# Patient Record
Sex: Female | Born: 1935 | Race: White | Hispanic: No
Health system: Southern US, Community
[De-identification: ages and names within clinical notes are randomized; demographics above are authoritative.]

## PROBLEM LIST (undated history)

## (undated) DIAGNOSIS — I4891 Unspecified atrial fibrillation: Secondary | ICD-10-CM

---

## 2004-02-14 ENCOUNTER — Ambulatory Visit: Payer: Self-pay | Admitting: Internal Medicine

## 2004-07-11 ENCOUNTER — Ambulatory Visit: Payer: Self-pay | Admitting: Rheumatology

## 2005-02-20 ENCOUNTER — Ambulatory Visit: Payer: Self-pay | Admitting: Internal Medicine

## 2005-08-21 ENCOUNTER — Encounter: Payer: Self-pay | Admitting: Internal Medicine

## 2006-02-25 ENCOUNTER — Ambulatory Visit: Payer: Self-pay | Admitting: Internal Medicine

## 2007-04-15 ENCOUNTER — Ambulatory Visit: Payer: Self-pay | Admitting: Internal Medicine

## 2007-07-07 ENCOUNTER — Ambulatory Visit: Payer: Self-pay | Admitting: Internal Medicine

## 2008-02-15 ENCOUNTER — Ambulatory Visit: Payer: Self-pay | Admitting: Internal Medicine

## 2008-04-27 ENCOUNTER — Ambulatory Visit: Payer: Self-pay | Admitting: Internal Medicine

## 2008-04-29 ENCOUNTER — Ambulatory Visit: Payer: Self-pay | Admitting: Internal Medicine

## 2008-05-06 ENCOUNTER — Ambulatory Visit: Payer: Self-pay | Admitting: Pain Medicine

## 2008-06-08 ENCOUNTER — Ambulatory Visit: Payer: Self-pay | Admitting: Pain Medicine

## 2008-06-09 ENCOUNTER — Ambulatory Visit: Payer: Self-pay | Admitting: Pain Medicine

## 2008-11-02 ENCOUNTER — Ambulatory Visit: Payer: Self-pay | Admitting: Internal Medicine

## 2009-04-19 ENCOUNTER — Ambulatory Visit: Payer: Self-pay | Admitting: Internal Medicine

## 2009-05-04 ENCOUNTER — Ambulatory Visit: Payer: Self-pay | Admitting: Internal Medicine

## 2009-06-13 ENCOUNTER — Ambulatory Visit: Payer: Self-pay | Admitting: Family Medicine

## 2010-06-05 ENCOUNTER — Ambulatory Visit: Payer: Self-pay | Admitting: Internal Medicine

## 2011-01-02 ENCOUNTER — Inpatient Hospital Stay: Payer: Self-pay | Admitting: Internal Medicine

## 2011-01-09 ENCOUNTER — Emergency Department: Payer: Self-pay | Admitting: Emergency Medicine

## 2011-03-10 ENCOUNTER — Inpatient Hospital Stay: Payer: Self-pay | Admitting: Internal Medicine

## 2011-03-10 LAB — COMPREHENSIVE METABOLIC PANEL
Albumin: 3.9 g/dL (ref 3.4–5.0)
Alkaline Phosphatase: 55 U/L (ref 50–136)
BUN: 16 mg/dL (ref 7–18)
Bilirubin,Total: 0.4 mg/dL (ref 0.2–1.0)
Co2: 28 mmol/L (ref 21–32)
Creatinine: 0.59 mg/dL — ABNORMAL LOW (ref 0.60–1.30)
EGFR (African American): >60
EGFR (Non-African Amer.): >60
Glucose: 119 mg/dL — ABNORMAL HIGH (ref 65–99)
SGOT(AST): 22 U/L (ref 15–37)
SGPT (ALT): 20 U/L
Total Protein: 7.2 g/dL (ref 6.4–8.2)

## 2011-03-10 LAB — CK TOTAL AND CKMB (NOT AT ARMC)
CK, Total: 67 U/L (ref 21–215)
CK-MB: 1.1 ng/mL (ref 0.5–3.6)

## 2011-03-10 LAB — CBC
HCT: 36.3 % (ref 35.0–47.0)
HGB: 11.9 g/dL — ABNORMAL LOW (ref 12.0–16.0)
MCH: 27.3 pg (ref 26.0–34.0)
MCV: 83 fL (ref 80–100)
RBC: 4.37 10*6/uL (ref 3.80–5.20)
WBC: 6.2 10*3/uL (ref 3.6–11.0)

## 2011-03-15 LAB — CULTURE, BLOOD (SINGLE)

## 2011-06-06 ENCOUNTER — Ambulatory Visit: Payer: Self-pay | Admitting: Internal Medicine

## 2012-01-26 ENCOUNTER — Inpatient Hospital Stay: Payer: Self-pay | Admitting: Internal Medicine

## 2012-01-26 LAB — CBC
HCT: 36.5 % (ref 35.0–47.0)
HGB: 11.7 g/dL — ABNORMAL LOW (ref 12.0–16.0)
MCH: 26.6 pg (ref 26.0–34.0)
MCV: 83 fL (ref 80–100)
RDW: 13.8 % (ref 11.5–14.5)
WBC: 6.7 10*3/uL (ref 3.6–11.0)

## 2012-01-26 LAB — COMPREHENSIVE METABOLIC PANEL
Albumin: 3.9 g/dL (ref 3.4–5.0)
Anion Gap: 9 (ref 7–16)
BUN: 18 mg/dL (ref 7–18)
Chloride: 105 mmol/L (ref 98–107)
Co2: 27 mmol/L (ref 21–32)
Creatinine: 0.72 mg/dL (ref 0.60–1.30)
EGFR (Non-African Amer.): >60
SGPT (ALT): 19 U/L (ref 12–78)
Sodium: 141 mmol/L (ref 136–145)
Total Protein: 7.4 g/dL (ref 6.4–8.2)

## 2012-01-27 LAB — CBC WITH DIFFERENTIAL/PLATELET
Basophil %: 1.2 %
Eosinophil #: 0.4 10*3/uL (ref 0.0–0.7)
Eosinophil %: 10.3 %
HCT: 33.7 % — ABNORMAL LOW (ref 35.0–47.0)
HGB: 11.2 g/dL — ABNORMAL LOW (ref 12.0–16.0)
Lymphocyte #: 1.3 10*3/uL (ref 1.0–3.6)
MCH: 27.3 pg (ref 26.0–34.0)
MCV: 82 fL (ref 80–100)
Monocyte #: 0.4 x10 3/mm (ref 0.2–0.9)
Monocyte %: 10.9 %
Neutrophil #: 1.9 10*3/uL (ref 1.4–6.5)
Neutrophil %: 45.5 %
RBC: 4.09 10*6/uL (ref 3.80–5.20)
WBC: 4.1 10*3/uL (ref 3.6–11.0)

## 2012-01-27 LAB — COMPREHENSIVE METABOLIC PANEL
Albumin: 3.5 g/dL (ref 3.4–5.0)
Anion Gap: 4 — ABNORMAL LOW (ref 7–16)
BUN: 18 mg/dL (ref 7–18)
Chloride: 106 mmol/L (ref 98–107)
Co2: 30 mmol/L (ref 21–32)
Creatinine: 0.68 mg/dL (ref 0.60–1.30)
EGFR (African American): >60
EGFR (Non-African Amer.): >60
Glucose: 97 mg/dL (ref 65–99)
Osmolality: 281 (ref 275–301)
SGOT(AST): 20 U/L (ref 15–37)
Total Protein: 6.6 g/dL (ref 6.4–8.2)

## 2012-01-27 LAB — TROPONIN I: Troponin-I: 0.09 ng/mL — ABNORMAL HIGH

## 2012-01-27 LAB — CK TOTAL AND CKMB (NOT AT ARMC): CK, Total: 74 U/L (ref 21–215)

## 2012-03-16 ENCOUNTER — Emergency Department: Payer: Self-pay | Admitting: Emergency Medicine

## 2012-03-16 LAB — CBC
HCT: 36.3 % (ref 35.0–47.0)
RDW: 13.7 % (ref 11.5–14.5)

## 2012-03-16 LAB — PRO B NATRIURETIC PEPTIDE: B-Type Natriuretic Peptide: 4973 pg/mL — ABNORMAL HIGH (ref 0–450)

## 2012-03-16 LAB — COMPREHENSIVE METABOLIC PANEL
Albumin: 3.6 g/dL (ref 3.4–5.0)
Alkaline Phosphatase: 82 U/L (ref 50–136)
Anion Gap: 7 (ref 7–16)
BUN: 16 mg/dL (ref 7–18)
Bilirubin,Total: 0.5 mg/dL (ref 0.2–1.0)
EGFR (African American): >60
Glucose: 92 mg/dL (ref 65–99)
Osmolality: 284 (ref 275–301)
Potassium: 3.8 mmol/L (ref 3.5–5.1)
SGOT(AST): 21 U/L (ref 15–37)
Sodium: 142 mmol/L (ref 136–145)

## 2012-06-13 ENCOUNTER — Ambulatory Visit: Payer: Self-pay | Admitting: Internal Medicine

## 2012-06-29 ENCOUNTER — Ambulatory Visit: Payer: Self-pay | Admitting: Internal Medicine

## 2012-07-10 ENCOUNTER — Observation Stay: Payer: Self-pay | Admitting: Internal Medicine

## 2012-07-10 LAB — URINALYSIS, COMPLETE
Bacteria: NONE SEEN
Bilirubin,UR: NEGATIVE
Glucose,UR: NEGATIVE mg/dL (ref 0–75)
Ketone: NEGATIVE
Protein: NEGATIVE
RBC,UR: 1 /HPF (ref 0–5)
Squamous Epithelial: <1
WBC UR: <1 /HPF (ref 0–5)

## 2012-07-10 LAB — BASIC METABOLIC PANEL
BUN: 21 mg/dL — ABNORMAL HIGH (ref 7–18)
Co2: 29 mmol/L (ref 21–32)
Creatinine: 0.85 mg/dL (ref 0.60–1.30)
EGFR (Non-African Amer.): >60
Glucose: 110 mg/dL — ABNORMAL HIGH (ref 65–99)
Osmolality: 281 (ref 275–301)
Potassium: 3.3 mmol/L — ABNORMAL LOW (ref 3.5–5.1)
Sodium: 139 mmol/L (ref 136–145)

## 2012-07-10 LAB — CK TOTAL AND CKMB (NOT AT ARMC)
CK, Total: 101 U/L (ref 21–215)
CK, Total: 100 U/L (ref 21–215)
CK-MB: 1.2 ng/mL (ref 0.5–3.6)
CK-MB: 1 ng/mL (ref 0.5–3.6)
CK-MB: 0.9 ng/mL (ref 0.5–3.6)

## 2012-07-10 LAB — CBC
HCT: 33.8 % — ABNORMAL LOW (ref 35.0–47.0)
MCH: 27.4 pg (ref 26.0–34.0)
MCHC: 33.9 g/dL (ref 32.0–36.0)
MCV: 81 fL (ref 80–100)
RBC: 4.18 10*6/uL (ref 3.80–5.20)
WBC: 5.9 10*3/uL (ref 3.6–11.0)

## 2012-07-10 LAB — MAGNESIUM: Magnesium: 2 mg/dL

## 2012-07-10 LAB — TROPONIN I: Troponin-I: 0.1 ng/mL — ABNORMAL HIGH

## 2012-07-11 LAB — LIPID PANEL
Cholesterol: 138 mg/dL (ref 0–200)
HDL Cholesterol: 40 mg/dL (ref 40–60)
Ldl Cholesterol, Calc: 82 mg/dL (ref 0–100)
Triglycerides: 78 mg/dL (ref 0–200)
VLDL Cholesterol, Calc: 16 mg/dL (ref 5–40)

## 2012-07-11 LAB — CBC WITH DIFFERENTIAL/PLATELET
Basophil %: 1.3 %
Eosinophil #: 0.6 10*3/uL (ref 0.0–0.7)
Eosinophil %: 11.8 %
HGB: 10.9 g/dL — ABNORMAL LOW (ref 12.0–16.0)
Lymphocyte %: 30.4 %
MCH: 27 pg (ref 26.0–34.0)
Monocyte #: 0.5 x10 3/mm (ref 0.2–0.9)
Neutrophil #: 2.4 10*3/uL (ref 1.4–6.5)
Neutrophil %: 46.9 %
Platelet: 179 10*3/uL (ref 150–440)
RBC: 4.02 10*6/uL (ref 3.80–5.20)
RDW: 13.9 % (ref 11.5–14.5)
WBC: 5.1 10*3/uL (ref 3.6–11.0)

## 2012-07-11 LAB — BASIC METABOLIC PANEL
Anion Gap: 7 (ref 7–16)
BUN: 16 mg/dL (ref 7–18)
Chloride: 104 mmol/L (ref 98–107)
Co2: 29 mmol/L (ref 21–32)
EGFR (African American): >60
Osmolality: 280 (ref 275–301)
Sodium: 140 mmol/L (ref 136–145)

## 2012-07-11 LAB — TSH: Thyroid Stimulating Horm: 0.796 u[IU]/mL

## 2012-07-29 ENCOUNTER — Ambulatory Visit: Payer: Self-pay | Admitting: Internal Medicine

## 2012-08-05 ENCOUNTER — Inpatient Hospital Stay: Payer: Self-pay | Admitting: Specialist

## 2012-08-05 LAB — BASIC METABOLIC PANEL
Anion Gap: 7 (ref 7–16)
BUN: 16 mg/dL (ref 7–18)
Calcium, Total: 9.5 mg/dL (ref 8.5–10.1)
Chloride: 104 mmol/L (ref 98–107)
Creatinine: 0.81 mg/dL (ref 0.60–1.30)
EGFR (African American): >60
Glucose: 102 mg/dL — ABNORMAL HIGH (ref 65–99)
Potassium: 3.9 mmol/L (ref 3.5–5.1)
Sodium: 140 mmol/L (ref 136–145)

## 2012-08-05 LAB — CBC
HCT: 34.4 % — ABNORMAL LOW (ref 35.0–47.0)
MCHC: 33.5 g/dL (ref 32.0–36.0)
MCV: 81 fL (ref 80–100)
Platelet: 185 10*3/uL (ref 150–440)
RBC: 4.26 10*6/uL (ref 3.80–5.20)
RDW: 14.1 % (ref 11.5–14.5)

## 2012-08-05 LAB — TROPONIN I: Troponin-I: 0.07 ng/mL — ABNORMAL HIGH

## 2012-08-05 LAB — CK TOTAL AND CKMB (NOT AT ARMC)
CK, Total: 78 U/L (ref 21–215)
CK-MB: 0.8 ng/mL (ref 0.5–3.6)

## 2012-08-06 LAB — CK TOTAL AND CKMB (NOT AT ARMC)
CK, Total: 81 U/L (ref 21–215)
CK-MB: 0.9 ng/mL (ref 0.5–3.6)
CK-MB: 0.8 ng/mL (ref 0.5–3.6)

## 2012-08-06 LAB — TROPONIN I: Troponin-I: 0.13 ng/mL — ABNORMAL HIGH

## 2012-08-07 LAB — BASIC METABOLIC PANEL
Anion Gap: 4 — ABNORMAL LOW (ref 7–16)
Calcium, Total: 9.3 mg/dL (ref 8.5–10.1)
Chloride: 104 mmol/L (ref 98–107)
Co2: 31 mmol/L (ref 21–32)
Osmolality: 280 (ref 275–301)
Potassium: 3.4 mmol/L — ABNORMAL LOW (ref 3.5–5.1)
Sodium: 139 mmol/L (ref 136–145)

## 2012-09-01 ENCOUNTER — Emergency Department: Payer: Self-pay | Admitting: Emergency Medicine

## 2012-09-01 LAB — URINALYSIS, COMPLETE
Blood: NEGATIVE
Glucose,UR: NEGATIVE mg/dL (ref 0–75)
Nitrite: NEGATIVE
Protein: NEGATIVE
Squamous Epithelial: <1
WBC UR: <1 /HPF (ref 0–5)

## 2012-09-01 LAB — COMPREHENSIVE METABOLIC PANEL
Albumin: 3.6 g/dL (ref 3.4–5.0)
Anion Gap: 4 — ABNORMAL LOW (ref 7–16)
Bilirubin,Total: 0.4 mg/dL (ref 0.2–1.0)
Calcium, Total: 9.4 mg/dL (ref 8.5–10.1)
Chloride: 104 mmol/L (ref 98–107)
Creatinine: 0.75 mg/dL (ref 0.60–1.30)
Potassium: 3.9 mmol/L (ref 3.5–5.1)
Sodium: 138 mmol/L (ref 136–145)

## 2012-09-01 LAB — PROTIME-INR
INR: 1
Prothrombin Time: 13.7 secs (ref 11.5–14.7)

## 2012-09-01 LAB — CBC
HCT: 33.9 % — ABNORMAL LOW (ref 35.0–47.0)
HGB: 11.4 g/dL — ABNORMAL LOW (ref 12.0–16.0)
MCV: 80 fL (ref 80–100)
RBC: 4.22 10*6/uL (ref 3.80–5.20)
RDW: 14 % (ref 11.5–14.5)
WBC: 4.1 10*3/uL (ref 3.6–11.0)

## 2012-09-05 LAB — COMPREHENSIVE METABOLIC PANEL
BUN: 24 mg/dL — ABNORMAL HIGH (ref 7–18)
Bilirubin,Total: 0.5 mg/dL (ref 0.2–1.0)
Calcium, Total: 9.1 mg/dL (ref 8.5–10.1)
Creatinine: 0.85 mg/dL (ref 0.60–1.30)
EGFR (African American): >60
EGFR (Non-African Amer.): >60
Glucose: 125 mg/dL — ABNORMAL HIGH (ref 65–99)
Potassium: 4.1 mmol/L (ref 3.5–5.1)
SGOT(AST): 150 U/L — ABNORMAL HIGH (ref 15–37)
SGPT (ALT): 153 U/L — ABNORMAL HIGH (ref 12–78)
Sodium: 137 mmol/L (ref 136–145)

## 2012-09-05 LAB — CBC
HCT: 34.1 % — ABNORMAL LOW (ref 35.0–47.0)
HGB: 11.3 g/dL — ABNORMAL LOW (ref 12.0–16.0)
MCV: 82 fL (ref 80–100)
Platelet: 147 10*3/uL — ABNORMAL LOW (ref 150–440)
RBC: 4.17 10*6/uL (ref 3.80–5.20)
WBC: 4.7 10*3/uL (ref 3.6–11.0)

## 2012-09-05 LAB — CK TOTAL AND CKMB (NOT AT ARMC)
CK, Total: 81 U/L (ref 21–215)
CK-MB: 0.6 ng/mL (ref 0.5–3.6)

## 2012-09-06 ENCOUNTER — Inpatient Hospital Stay: Payer: Self-pay | Admitting: Internal Medicine

## 2012-10-25 ENCOUNTER — Inpatient Hospital Stay: Payer: Self-pay | Admitting: Specialist

## 2012-10-25 LAB — BASIC METABOLIC PANEL
Anion Gap: 5 — ABNORMAL LOW (ref 7–16)
BUN: 12 mg/dL (ref 7–18)
EGFR (African American): >60
Glucose: 112 mg/dL — ABNORMAL HIGH (ref 65–99)
Sodium: 134 mmol/L — ABNORMAL LOW (ref 136–145)

## 2012-10-25 LAB — CK-MB
CK-MB: 1.9 ng/mL (ref 0.5–3.6)
CK-MB: 1.9 ng/mL (ref 0.5–3.6)

## 2012-10-25 LAB — CBC
HGB: 10.5 g/dL — ABNORMAL LOW (ref 12.0–16.0)
MCH: 26.9 pg (ref 26.0–34.0)
RBC: 3.89 10*6/uL (ref 3.80–5.20)
RDW: 14.7 % — ABNORMAL HIGH (ref 11.5–14.5)

## 2012-10-25 LAB — DIGOXIN LEVEL: Digoxin: 0.63 ng/mL

## 2012-10-25 LAB — PRO B NATRIURETIC PEPTIDE: B-Type Natriuretic Peptide: 10574 pg/mL — ABNORMAL HIGH (ref 0–450)

## 2012-10-26 LAB — BASIC METABOLIC PANEL
Anion Gap: 5 — ABNORMAL LOW (ref 7–16)
BUN: 16 mg/dL (ref 7–18)
Co2: 32 mmol/L (ref 21–32)
Osmolality: 277 (ref 275–301)
Potassium: 3.5 mmol/L (ref 3.5–5.1)

## 2012-12-05 ENCOUNTER — Encounter: Payer: Self-pay | Admitting: Cardiothoracic Surgery

## 2012-12-29 ENCOUNTER — Encounter: Payer: Self-pay | Admitting: Cardiothoracic Surgery

## 2013-01-29 ENCOUNTER — Encounter: Payer: Self-pay | Admitting: Cardiothoracic Surgery

## 2013-03-01 ENCOUNTER — Encounter: Payer: Self-pay | Admitting: Cardiothoracic Surgery

## 2013-08-11 ENCOUNTER — Ambulatory Visit: Payer: Self-pay | Admitting: Family Medicine

## 2013-09-10 ENCOUNTER — Ambulatory Visit: Payer: Self-pay | Admitting: Unknown Physician Specialty

## 2013-11-23 ENCOUNTER — Ambulatory Visit: Payer: Self-pay | Admitting: Unknown Physician Specialty

## 2014-01-06 ENCOUNTER — Observation Stay: Payer: Self-pay | Admitting: Internal Medicine

## 2014-01-06 LAB — COMPREHENSIVE METABOLIC PANEL
ALBUMIN: 3.4 g/dL (ref 3.4–5.0)
ALK PHOS: 76 U/L
ALT: 26 U/L
AST: 27 U/L (ref 15–37)
Anion Gap: 9 (ref 7–16)
BUN: 15 mg/dL (ref 7–18)
Bilirubin,Total: 0.6 mg/dL (ref 0.2–1.0)
CHLORIDE: 96 mmol/L — AB (ref 98–107)
Calcium, Total: 8.6 mg/dL (ref 8.5–10.1)
Co2: 27 mmol/L (ref 21–32)
Creatinine: 0.67 mg/dL (ref 0.60–1.30)
EGFR (African American): >60
EGFR (Non-African Amer.): >60
Glucose: 135 mg/dL — ABNORMAL HIGH (ref 65–99)
OSMOLALITY: 267 (ref 275–301)
Potassium: 3.3 mmol/L — ABNORMAL LOW (ref 3.5–5.1)
SODIUM: 132 mmol/L — AB (ref 136–145)
Total Protein: 7.3 g/dL (ref 6.4–8.2)

## 2014-01-06 LAB — CBC
HCT: 32.6 % — AB (ref 35.0–47.0)
HGB: 10.5 g/dL — ABNORMAL LOW (ref 12.0–16.0)
MCH: 26.7 pg (ref 26.0–34.0)
MCHC: 32.2 g/dL (ref 32.0–36.0)
MCV: 83 fL (ref 80–100)
PLATELETS: 135 10*3/uL — AB (ref 150–440)
RBC: 3.93 10*6/uL (ref 3.80–5.20)
RDW: 14.3 % (ref 11.5–14.5)
WBC: 7.9 10*3/uL (ref 3.6–11.0)

## 2014-01-06 LAB — PRO B NATRIURETIC PEPTIDE: B-TYPE NATIURETIC PEPTID: 10726 pg/mL — AB (ref 0–450)

## 2014-01-06 LAB — TROPONIN I
Troponin-I: 0.25 ng/mL — ABNORMAL HIGH
Troponin-I: 0.25 ng/mL — ABNORMAL HIGH

## 2014-01-06 LAB — CK TOTAL AND CKMB (NOT AT ARMC)
CK, TOTAL: 122 U/L (ref 26–192)
CK-MB: 0.8 ng/mL (ref 0.5–3.6)

## 2014-01-07 LAB — BASIC METABOLIC PANEL
Anion Gap: 7 (ref 7–16)
BUN: 22 mg/dL — ABNORMAL HIGH (ref 7–18)
Calcium, Total: 8.7 mg/dL (ref 8.5–10.1)
Chloride: 96 mmol/L — ABNORMAL LOW (ref 98–107)
Co2: 31 mmol/L (ref 21–32)
Creatinine: 1.02 mg/dL (ref 0.60–1.30)
EGFR (African American): >60
EGFR (Non-African Amer.): 55 — ABNORMAL LOW
Glucose: 114 mg/dL — ABNORMAL HIGH (ref 65–99)
Osmolality: 272 (ref 275–301)
Potassium: 3.3 mmol/L — ABNORMAL LOW (ref 3.5–5.1)
Sodium: 134 mmol/L — ABNORMAL LOW (ref 136–145)

## 2014-01-07 LAB — TSH: Thyroid Stimulating Horm: 1.32 u[IU]/mL

## 2014-04-10 ENCOUNTER — Inpatient Hospital Stay (HOSPITAL_COMMUNITY): Payer: Medicare Other

## 2014-04-10 ENCOUNTER — Inpatient Hospital Stay (HOSPITAL_COMMUNITY): Payer: Medicare Other | Admitting: Certified Registered"

## 2014-04-10 ENCOUNTER — Encounter (HOSPITAL_COMMUNITY)
Admission: EM | Disposition: A | Discharge status: Hospice | Payer: Self-pay | Source: Home / Self Care | Attending: Neurology

## 2014-04-10 ENCOUNTER — Emergency Department (HOSPITAL_COMMUNITY): Payer: Medicare Other

## 2014-04-10 ENCOUNTER — Inpatient Hospital Stay (HOSPITAL_COMMUNITY)
Admission: EM | Admit: 2014-04-10 | Discharge: 2014-04-16 | DRG: 023 | Disposition: A | Discharge status: Hospice | Payer: Medicare Other | Attending: Neurology | Admitting: Neurology

## 2014-04-10 ENCOUNTER — Encounter (HOSPITAL_COMMUNITY): Payer: Self-pay

## 2014-04-10 DIAGNOSIS — R1314 Dysphagia, pharyngoesophageal phase: Secondary | ICD-10-CM | POA: Insufficient documentation

## 2014-04-10 DIAGNOSIS — Z7982 Long term (current) use of aspirin: Secondary | ICD-10-CM | POA: Diagnosis not present

## 2014-04-10 DIAGNOSIS — D649 Anemia, unspecified: Secondary | ICD-10-CM | POA: Diagnosis present

## 2014-04-10 DIAGNOSIS — R918 Other nonspecific abnormal finding of lung field: Secondary | ICD-10-CM

## 2014-04-10 DIAGNOSIS — I10 Essential (primary) hypertension: Secondary | ICD-10-CM | POA: Diagnosis present

## 2014-04-10 DIAGNOSIS — I639 Cerebral infarction, unspecified: Secondary | ICD-10-CM | POA: Diagnosis present

## 2014-04-10 DIAGNOSIS — Z880 Allergy status to penicillin: Secondary | ICD-10-CM | POA: Diagnosis not present

## 2014-04-10 DIAGNOSIS — I63319 Cerebral infarction due to thrombosis of unspecified middle cerebral artery: Secondary | ICD-10-CM | POA: Diagnosis not present

## 2014-04-10 DIAGNOSIS — Z66 Do not resuscitate: Secondary | ICD-10-CM | POA: Diagnosis not present

## 2014-04-10 DIAGNOSIS — G8929 Other chronic pain: Secondary | ICD-10-CM | POA: Diagnosis not present

## 2014-04-10 DIAGNOSIS — I472 Ventricular tachycardia: Secondary | ICD-10-CM | POA: Diagnosis not present

## 2014-04-10 DIAGNOSIS — I482 Chronic atrial fibrillation, unspecified: Secondary | ICD-10-CM | POA: Insufficient documentation

## 2014-04-10 DIAGNOSIS — I6350 Cerebral infarction due to unspecified occlusion or stenosis of unspecified cerebral artery: Secondary | ICD-10-CM | POA: Diagnosis not present

## 2014-04-10 DIAGNOSIS — J9600 Acute respiratory failure, unspecified whether with hypoxia or hypercapnia: Secondary | ICD-10-CM | POA: Diagnosis present

## 2014-04-10 DIAGNOSIS — G8191 Hemiplegia, unspecified affecting right dominant side: Secondary | ICD-10-CM | POA: Diagnosis present

## 2014-04-10 DIAGNOSIS — E119 Type 2 diabetes mellitus without complications: Secondary | ICD-10-CM | POA: Diagnosis present

## 2014-04-10 DIAGNOSIS — Q251 Coarctation of aorta: Secondary | ICD-10-CM

## 2014-04-10 DIAGNOSIS — I63132 Cerebral infarction due to embolism of left carotid artery: Secondary | ICD-10-CM | POA: Diagnosis not present

## 2014-04-10 DIAGNOSIS — I671 Cerebral aneurysm, nonruptured: Secondary | ICD-10-CM | POA: Diagnosis present

## 2014-04-10 DIAGNOSIS — I63412 Cerebral infarction due to embolism of left middle cerebral artery: Principal | ICD-10-CM | POA: Diagnosis present

## 2014-04-10 DIAGNOSIS — Z978 Presence of other specified devices: Secondary | ICD-10-CM

## 2014-04-10 DIAGNOSIS — Z7951 Long term (current) use of inhaled steroids: Secondary | ICD-10-CM | POA: Diagnosis not present

## 2014-04-10 DIAGNOSIS — E785 Hyperlipidemia, unspecified: Secondary | ICD-10-CM | POA: Diagnosis present

## 2014-04-10 DIAGNOSIS — E0590 Thyrotoxicosis, unspecified without thyrotoxic crisis or storm: Secondary | ICD-10-CM | POA: Diagnosis present

## 2014-04-10 DIAGNOSIS — Z881 Allergy status to other antibiotic agents status: Secondary | ICD-10-CM | POA: Diagnosis not present

## 2014-04-10 DIAGNOSIS — Z515 Encounter for palliative care: Secondary | ICD-10-CM | POA: Diagnosis not present

## 2014-04-10 DIAGNOSIS — Z0189 Encounter for other specified special examinations: Secondary | ICD-10-CM

## 2014-04-10 DIAGNOSIS — Z952 Presence of prosthetic heart valve: Secondary | ICD-10-CM | POA: Diagnosis not present

## 2014-04-10 DIAGNOSIS — I6300 Cerebral infarction due to thrombosis of unspecified precerebral artery: Secondary | ICD-10-CM | POA: Diagnosis not present

## 2014-04-10 DIAGNOSIS — E43 Unspecified severe protein-calorie malnutrition: Secondary | ICD-10-CM | POA: Diagnosis present

## 2014-04-10 DIAGNOSIS — I5023 Acute on chronic systolic (congestive) heart failure: Secondary | ICD-10-CM | POA: Diagnosis present

## 2014-04-10 DIAGNOSIS — E876 Hypokalemia: Secondary | ICD-10-CM | POA: Diagnosis present

## 2014-04-10 DIAGNOSIS — R4701 Aphasia: Secondary | ICD-10-CM | POA: Diagnosis present

## 2014-04-10 DIAGNOSIS — I509 Heart failure, unspecified: Secondary | ICD-10-CM

## 2014-04-10 DIAGNOSIS — Z888 Allergy status to other drugs, medicaments and biological substances status: Secondary | ICD-10-CM | POA: Diagnosis not present

## 2014-04-10 HISTORY — PX: RADIOLOGY WITH ANESTHESIA: SHX6223

## 2014-04-10 HISTORY — DX: Unspecified atrial fibrillation: I48.91

## 2014-04-10 LAB — I-STAT CHEM 8, ED
BUN: 25 mg/dL — AB (ref 6–23)
CHLORIDE: 102 mmol/L (ref 96–112)
Calcium, Ion: 1.17 mmol/L (ref 1.13–1.30)
Creatinine, Ser: 1 mg/dL (ref 0.50–1.10)
GLUCOSE: 100 mg/dL — AB (ref 70–99)
HEMATOCRIT: 37 % (ref 36.0–46.0)
Hemoglobin: 12.6 g/dL (ref 12.0–15.0)
POTASSIUM: 4.2 mmol/L (ref 3.5–5.1)
SODIUM: 140 mmol/L (ref 135–145)
TCO2: 24 mmol/L (ref 0–100)

## 2014-04-10 LAB — GLUCOSE, CAPILLARY: GLUCOSE-CAPILLARY: 185 mg/dL — AB (ref 70–99)

## 2014-04-10 LAB — CBG MONITORING, ED: GLUCOSE-CAPILLARY: 103 mg/dL — AB (ref 70–99)

## 2014-04-10 LAB — I-STAT TROPONIN, ED: Troponin i, poc: 0.11 ng/mL (ref 0.00–0.08)

## 2014-04-10 LAB — COMPREHENSIVE METABOLIC PANEL
ALBUMIN: 3.9 g/dL (ref 3.5–5.2)
ALT: 16 U/L (ref 0–35)
AST: 26 U/L (ref 0–37)
Alkaline Phosphatase: 71 U/L (ref 39–117)
Anion gap: 12 (ref 5–15)
BILIRUBIN TOTAL: 0.7 mg/dL (ref 0.3–1.2)
BUN: 22 mg/dL (ref 6–23)
CO2: 27 mmol/L (ref 19–32)
Calcium: 9.8 mg/dL (ref 8.4–10.5)
Chloride: 101 mmol/L (ref 96–112)
Creatinine, Ser: 0.93 mg/dL (ref 0.50–1.10)
GFR calc non Af Amer: 54 mL/min — ABNORMAL LOW (ref >90)
GFR, EST AFRICAN AMERICAN: 63 mL/min — AB (ref >90)
GLUCOSE: 103 mg/dL — AB (ref 70–99)
POTASSIUM: 4.3 mmol/L (ref 3.5–5.1)
SODIUM: 140 mmol/L (ref 135–145)
TOTAL PROTEIN: 7.1 g/dL (ref 6.0–8.3)

## 2014-04-10 LAB — DIFFERENTIAL
Basophils Absolute: 0 10*3/uL (ref 0.0–0.1)
Basophils Relative: 1 % (ref 0–1)
Eosinophils Absolute: 0.3 10*3/uL (ref 0.0–0.7)
Eosinophils Relative: 5 % (ref 0–5)
LYMPHS ABS: 2.2 10*3/uL (ref 0.7–4.0)
Lymphocytes Relative: 40 % (ref 12–46)
MONO ABS: 0.6 10*3/uL (ref 0.1–1.0)
Monocytes Relative: 11 % (ref 3–12)
NEUTROS ABS: 2.3 10*3/uL (ref 1.7–7.7)
Neutrophils Relative %: 43 % (ref 43–77)

## 2014-04-10 LAB — PROTIME-INR
INR: 1.03 (ref 0.00–1.49)
Prothrombin Time: 13.6 seconds (ref 11.6–15.2)

## 2014-04-10 LAB — CBC
HCT: 34.3 % — ABNORMAL LOW (ref 36.0–46.0)
HEMOGLOBIN: 11.3 g/dL — AB (ref 12.0–15.0)
MCH: 27.2 pg (ref 26.0–34.0)
MCHC: 32.9 g/dL (ref 30.0–36.0)
MCV: 82.5 fL (ref 78.0–100.0)
PLATELETS: 171 10*3/uL (ref 150–400)
RBC: 4.16 MIL/uL (ref 3.87–5.11)
RDW: 14.5 % (ref 11.5–15.5)
WBC: 5.4 10*3/uL (ref 4.0–10.5)

## 2014-04-10 LAB — APTT: aPTT: 28 seconds (ref 24–37)

## 2014-04-10 LAB — DIGOXIN LEVEL: Digoxin Level: 0.6 ng/mL — ABNORMAL LOW (ref 0.8–2.0)

## 2014-04-10 LAB — MRSA PCR SCREENING: MRSA by PCR: NEGATIVE

## 2014-04-10 SURGERY — RADIOLOGY WITH ANESTHESIA
Anesthesia: Monitor Anesthesia Care

## 2014-04-10 MED ORDER — DEXMEDETOMIDINE HCL IN NACL 200 MCG/50ML IV SOLN
0.0000 ug/kg/h | INTRAVENOUS | Status: DC
  Administered 2014-04-10: 0.4 ug/kg/h via INTRAVENOUS
  Administered 2014-04-10: 0.8 ug/kg/h via INTRAVENOUS
  Administered 2014-04-11: 0.4 ug/kg/h via INTRAVENOUS
  Administered 2014-04-11: 0.3 ug/kg/h via INTRAVENOUS
  Filled 2014-04-10 (×4): qty 50

## 2014-04-10 MED ORDER — PRAVASTATIN SODIUM 20 MG PO TABS
20.0000 mg | ORAL_TABLET | Freq: Every day | ORAL | Status: DC
  Filled 2014-04-10 (×2): qty 1

## 2014-04-10 MED ORDER — VECURONIUM BROMIDE 10 MG IV SOLR
INTRAVENOUS | Status: DC | PRN
  Administered 2014-04-10: 2 mg via INTRAVENOUS
  Administered 2014-04-10: 1 mg via INTRAVENOUS
  Administered 2014-04-10: 4 mg via INTRAVENOUS
  Administered 2014-04-10: 2 mg via INTRAVENOUS

## 2014-04-10 MED ORDER — ACETAMINOPHEN 650 MG RE SUPP: 650.0000 mg | RECTAL | Status: DC | PRN

## 2014-04-10 MED ORDER — SODIUM CHLORIDE 0.9 % IV SOLN
INTRAVENOUS | Status: DC | PRN
  Administered 2014-04-10 (×2): via INTRAVENOUS

## 2014-04-10 MED ORDER — NICARDIPINE HCL IN NACL 20-0.86 MG/200ML-% IV SOLN: 5.0000 mg/h | INTRAVENOUS | Status: DC

## 2014-04-10 MED ORDER — ALTEPLASE (STROKE) FULL DOSE INFUSION
0.9000 mg/kg | Freq: Once | INTRAVENOUS | Status: AC
  Administered 2014-04-10: 41 mg via INTRAVENOUS
  Filled 2014-04-10: qty 41

## 2014-04-10 MED ORDER — METHIMAZOLE 5 MG PO TABS
5.0000 mg | ORAL_TABLET | Freq: Every day | ORAL | Status: DC
  Administered 2014-04-11 – 2014-04-13 (×3): 5 mg
  Filled 2014-04-10 (×6): qty 1

## 2014-04-10 MED ORDER — HYDRALAZINE HCL 20 MG/ML IJ SOLN
10.0000 mg | INTRAMUSCULAR | Status: DC | PRN
  Administered 2014-04-10 – 2014-04-13 (×2): 20 mg via INTRAVENOUS
  Administered 2014-04-13: 10 mg via INTRAVENOUS
  Administered 2014-04-15: 20 mg via INTRAVENOUS
  Filled 2014-04-10 (×4): qty 1

## 2014-04-10 MED ORDER — ACETAMINOPHEN 650 MG RE SUPP: 650.0000 mg | Freq: Four times a day (QID) | RECTAL | Status: DC | PRN

## 2014-04-10 MED ORDER — SUCCINYLCHOLINE CHLORIDE 20 MG/ML IJ SOLN
INTRAMUSCULAR | Status: AC | PRN
  Administered 2014-04-10: 100 mg via INTRAVENOUS

## 2014-04-10 MED ORDER — VANCOMYCIN HCL IN DEXTROSE 1-5 GM/200ML-% IV SOLN
1000.0000 mg | Freq: Once | INTRAVENOUS | Status: AC
  Administered 2014-04-10: 1000 mg via INTRAVENOUS
  Filled 2014-04-10: qty 200

## 2014-04-10 MED ORDER — PANTOPRAZOLE SODIUM 40 MG IV SOLR
40.0000 mg | Freq: Every day | INTRAVENOUS | Status: DC
  Administered 2014-04-10 – 2014-04-14 (×5): 40 mg via INTRAVENOUS
  Filled 2014-04-10 (×7): qty 40

## 2014-04-10 MED ORDER — ACETAMINOPHEN 500 MG PO TABS: 1000.0000 mg | ORAL_TABLET | Freq: Four times a day (QID) | ORAL | Status: DC | PRN

## 2014-04-10 MED ORDER — LIDOCAINE HCL (CARDIAC) 20 MG/ML IV SOLN
INTRAVENOUS | Status: AC
  Filled 2014-04-10: qty 5

## 2014-04-10 MED ORDER — SENNOSIDES-DOCUSATE SODIUM 8.6-50 MG PO TABS
1.0000 | ORAL_TABLET | Freq: Every evening | ORAL | Status: DC | PRN
  Filled 2014-04-10: qty 1

## 2014-04-10 MED ORDER — PHENYLEPHRINE HCL 10 MG/ML IJ SOLN
30.0000 ug/min | INTRAVENOUS | Status: DC
  Administered 2014-04-10: 30 ug/min via INTRAVENOUS
  Administered 2014-04-11: 10 ug/min via INTRAVENOUS
  Administered 2014-04-11: 15 ug/min via INTRAVENOUS
  Filled 2014-04-10 (×3): qty 1

## 2014-04-10 MED ORDER — DILTIAZEM HCL 25 MG/5ML IV SOLN: 10.0000 mg | Freq: Once | INTRAVENOUS | Status: DC

## 2014-04-10 MED ORDER — ETOMIDATE 2 MG/ML IV SOLN
INTRAVENOUS | Status: AC | PRN
  Administered 2014-04-10: 10 mg via INTRAVENOUS

## 2014-04-10 MED ORDER — CHLORHEXIDINE GLUCONATE 0.12 % MT SOLN
15.0000 mL | Freq: Two times a day (BID) | OROMUCOSAL | Status: DC
  Administered 2014-04-10 – 2014-04-13 (×6): 15 mL via OROMUCOSAL
  Filled 2014-04-10 (×5): qty 15

## 2014-04-10 MED ORDER — MIDAZOLAM HCL 2 MG/2ML IJ SOLN
2.0000 mg | Freq: Once | INTRAMUSCULAR | Status: AC
  Administered 2014-04-10: 2 mg via INTRAVENOUS
  Filled 2014-04-10: qty 2

## 2014-04-10 MED ORDER — SODIUM CHLORIDE 0.9 % IV BOLUS (SEPSIS): 500.0000 mL | Freq: Once | INTRAVENOUS | Status: DC

## 2014-04-10 MED ORDER — IOHEXOL 350 MG/ML SOLN
50.0000 mL | Freq: Once | INTRAVENOUS | Status: AC | PRN
  Administered 2014-04-10: 50 mL via INTRAVENOUS

## 2014-04-10 MED ORDER — IOHEXOL 300 MG/ML  SOLN
100.0000 mL | Freq: Once | INTRAMUSCULAR | Status: AC | PRN
  Administered 2014-04-10: 120 mL via INTRAVENOUS

## 2014-04-10 MED ORDER — LIDOCAINE HCL 1 % IJ SOLN
INTRAMUSCULAR | Status: AC
  Filled 2014-04-10: qty 20

## 2014-04-10 MED ORDER — FENTANYL CITRATE 0.05 MG/ML IJ SOLN: 50.0000 ug | INTRAMUSCULAR | Status: DC | PRN

## 2014-04-10 MED ORDER — ALTEPLASE 30 MG/30 ML FOR INTERV. RAD
1.0000 mg | INTRA_ARTERIAL | Status: AC | PRN
  Administered 2014-04-10: 5 mg via INTRA_ARTERIAL
  Administered 2014-04-10: 2 mg via INTRA_ARTERIAL
  Administered 2014-04-10: 1 mg via INTRA_ARTERIAL
  Filled 2014-04-10: qty 30

## 2014-04-10 MED ORDER — SODIUM CHLORIDE 0.9 % IV SOLN
INTRAVENOUS | Status: DC
  Administered 2014-04-10 – 2014-04-11 (×2): via INTRAVENOUS

## 2014-04-10 MED ORDER — LABETALOL HCL 5 MG/ML IV SOLN
INTRAVENOUS | Status: DC | PRN
  Administered 2014-04-10: 10 mg via INTRAVENOUS

## 2014-04-10 MED ORDER — CETYLPYRIDINIUM CHLORIDE 0.05 % MT LIQD
7.0000 mL | Freq: Four times a day (QID) | OROMUCOSAL | Status: DC
  Administered 2014-04-11 – 2014-04-13 (×12): 7 mL via OROMUCOSAL

## 2014-04-10 MED ORDER — ONDANSETRON HCL 4 MG/2ML IJ SOLN: 4.0000 mg | Freq: Four times a day (QID) | INTRAMUSCULAR | Status: DC | PRN

## 2014-04-10 MED ORDER — PANTOPRAZOLE SODIUM 40 MG IV SOLR: 40.0000 mg | Freq: Every day | INTRAVENOUS | Status: DC

## 2014-04-10 MED ORDER — NITROGLYCERIN 1 MG/10 ML FOR IR/CATH LAB
INTRA_ARTERIAL | Status: AC
  Filled 2014-04-10: qty 10

## 2014-04-10 MED ORDER — ARTIFICIAL TEARS OP OINT
TOPICAL_OINTMENT | OPHTHALMIC | Status: DC | PRN
  Administered 2014-04-10: 1 via OPHTHALMIC

## 2014-04-10 MED ORDER — LABETALOL HCL 5 MG/ML IV SOLN
INTRAVENOUS | Status: AC
  Filled 2014-04-10: qty 4

## 2014-04-10 MED ORDER — LABETALOL HCL 5 MG/ML IV SOLN
10.0000 mg | INTRAVENOUS | Status: DC | PRN
  Administered 2014-04-13: 10 mg via INTRAVENOUS
  Filled 2014-04-10: qty 4

## 2014-04-10 MED ORDER — STROKE: EARLY STAGES OF RECOVERY BOOK
Freq: Once | Status: DC
Start: 2014-04-10 — End: 2014-04-15
  Filled 2014-04-10: qty 1

## 2014-04-10 MED ORDER — SUCCINYLCHOLINE CHLORIDE 20 MG/ML IJ SOLN
INTRAMUSCULAR | Status: AC
  Filled 2014-04-10: qty 1

## 2014-04-10 MED ORDER — PROPOFOL 10 MG/ML IV EMUL
INTRAVENOUS | Status: AC
  Administered 2014-04-10: 1000 mg
  Filled 2014-04-10: qty 100

## 2014-04-10 MED ORDER — ACETAMINOPHEN 325 MG PO TABS: 650.0000 mg | ORAL_TABLET | ORAL | Status: DC | PRN

## 2014-04-10 MED ORDER — FENTANYL CITRATE 0.05 MG/ML IJ SOLN
INTRAMUSCULAR | Status: DC | PRN
  Administered 2014-04-10 (×3): 50 ug via INTRAVENOUS
  Administered 2014-04-10: 100 ug via INTRAVENOUS

## 2014-04-10 MED ORDER — PHENYLEPHRINE HCL 10 MG/ML IJ SOLN
10.0000 mg | INTRAVENOUS | Status: DC | PRN
  Administered 2014-04-10: 40 ug/min via INTRAVENOUS
  Administered 2014-04-10: 20 ug/min via INTRAVENOUS

## 2014-04-10 MED ORDER — ETOMIDATE 2 MG/ML IV SOLN
INTRAVENOUS | Status: AC
  Filled 2014-04-10: qty 20

## 2014-04-10 MED ORDER — ROCURONIUM BROMIDE 50 MG/5ML IV SOLN
INTRAVENOUS | Status: AC
  Filled 2014-04-10: qty 2

## 2014-04-10 NOTE — Sedation Documentation (Signed)
MD Hyacinth MeekerMiller successfully intibated.

## 2014-04-10 NOTE — Progress Notes (Signed)
Spoke with pt son, [name], he is her healthcare power of attorney and states that she is a DNR. Ron will bring paperwork to the hospital for her chart 04/11/2014.  Acheron Sugg GARNER

## 2014-04-10 NOTE — Anesthesia Preprocedure Evaluation (Signed)
Anesthesia Evaluation    Reviewed: Allergy & Precautions, NPO status , Patient's Chart, lab work & pertinent test results, Unable to perform ROS - Chart review onlyPreop documentation limited or incomplete due to emergent nature of procedure.  Airway Mallampati: Intubated       Dental   Pulmonary          Cardiovascular + dysrhythmias Atrial Fibrillation     Neuro/Psych    GI/Hepatic   Endo/Other    Renal/GU      Musculoskeletal   Abdominal   Peds  Hematology   Anesthesia Other Findings   Reproductive/Obstetrics                             Anesthesia Physical Anesthesia Plan  ASA: IV and emergent  Anesthesia Plan: General   Post-op Pain Management:    Induction:   Airway Management Planned: Oral ETT  Additional Equipment: Arterial line  Intra-op Plan:   Post-operative Plan: Post-operative intubation/ventilation  Informed Consent: I have reviewed the patients History and Physical, chart, labs and discussed the procedure including the risks, benefits and alternatives for the proposed anesthesia with the patient or authorized representative who has indicated his/her understanding and acceptance.   History available from chart only and Only emergency history available  Plan Discussed with: CRNA, Anesthesiologist and Surgeon  Anesthesia Plan Comments:         Anesthesia Quick Evaluation

## 2014-04-10 NOTE — ED Notes (Signed)
The patients critical I-stat troponin of 0.11 was shown to Dr. Hyacinth MeekerMiller.

## 2014-04-10 NOTE — Progress Notes (Signed)
eLink Physician-Brief Progress Note Patient Name: [name] DOB: [birth year] MRN: [id number]   Date of Service  04/10/2014  HPI/Events of Note  Dropped bp on propofol > on hold   Intake/Output Summary (Last 24 hours) at 04/10/14 1812 Last data filed at 04/10/14 1715  Gross per 24 hour  Intake    800 ml  Output   1525 ml  Net   -725 ml    Goal is bp around 149 sys   Lab Results  Component Value Date   WBC 5.4 04/10/2014   HGB 12.6 04/10/2014   HCT 37.0 04/10/2014   MCV 82.5 04/10/2014   PLT 171 04/10/2014        eICU Interventions  NS x 500 x 2 if needed Neo drip as pt in afib      Intervention Category Major Interventions: Shock - evaluation and management  Sandrea HughsMichael Leslea Vowles 04/10/2014, 6:12 PM

## 2014-04-10 NOTE — Consult Note (Signed)
PULMONARY / CRITICAL CARE MEDICINE   Name: [name] MRN: [id number] DOB: [birth year]    ADMISSION DATE:  04/10/2014 CONSULTATION DATE:  3/12  REFERRING MD :  Leroy Kennedyamilo   CHIEF COMPLAINT:  Vent management s/p CVA   INITIAL PRESENTATION:  81 year old female admitted on 3/12 s/p left MCA territory CVA. Was given systemic TPA in ER then went to IR for endovascular intervention which included: intra-arterial TPA and clot retrieval. Returned to ICU on vent.  PCCM was asked to help w/ post op vent management   STUDIES:  CT angio 3/12:  Occlusion of the left middle cerebral artery at the bifurcation with opacification of only the most anterior MCA branch vessels. Some pial collaterals are evident. Evidence of early infarct involving the anterior left MCA distribution with cortical indistinctness in decreased cortical definition in the posterior left insular cortex. The basal ganglia are intact. Marked diffuse white matter hypoattenuation is evident bilaterally, compatible with chronic microvascular ischemia.Extensive atherosclerotic changes involving with the carotid bifurcations bilaterally and cavernous internal carotid arteries without significant stenoses. Moderate tortuosity throughout the neck.Prominent thyroid goiter, more evident on the right. Scoliosis.  SIGNIFICANT EVENTS: 3/12 TPA in ER then went to IR for Endovascular  3/12: S/P 4 vessel cerebral arteriogram followed by near complete revascularization of occluded Lt MCA mid M1 segment with X2 passes of 4mm x 30 mm Trevoprovue retrieval device and 8mg  of Superselective intracranial TPA with establishment of TICI 2B flow In the LT MCA distribution  HISTORY OF PRESENT ILLNESS:   81 year old w/ significant h/o AF, HTN and prior AVR (remote). Not on anticoagulation. Lives at home independent. Still drives. Was at gas station pumping gas and was witnessed to fall against care w/ right sided facial droop and right sided hemiparesis. EMS  was called. On arrival she was globally aphasic. Dx eval via CT brain: c/w left MCA CVA. 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 She was intubated for progressive MS decline, was given IV TPA in ER started at 1223, then transferred to IR for Endovascular intervention   PAST MEDICAL HISTORY :   has a past medical history of Atrial fibrillation.  has no past surgical history on file. Prior to Admission medications   Medication Sig Start Date End Date Taking? Authorizing Provider  aspirin 325 MG tablet Take 325 mg by mouth daily.   Yes Historical Provider, MD  beta carotene w/minerals (OCUVITE) tablet Take 1 tablet by mouth daily.   Yes Historical Provider, MD  DIGOX 125 MCG tablet Take 125 mcg by mouth daily. 03/15/14  Yes Historical Provider, MD  furosemide (LASIX) 40 MG tablet Take 60 mg by mouth 2 (two) times daily. 03/29/14  Yes Historical Provider, MD  lisinopril (PRINIVIL,ZESTRIL) 2.5 MG tablet Take 2.5 mg by mouth daily. 03/15/14  Yes Historical Provider, MD  methimazole (TAPAZOLE) 5 MG tablet Take 5 mg by mouth daily. 03/22/14  Yes Historical Provider, MD  metoprolol (LOPRESSOR) 100 MG tablet Take 50 mg by mouth 2 (two) times daily. 03/22/14  Yes Historical Provider, MD  omeprazole (PRILOSEC) 40 MG capsule Take 40 mg by mouth daily. 03/01/14  Yes Historical Provider, MD  potassium chloride SA (K-DUR,KLOR-CON) 20 MEQ tablet Take 30 mEq by mouth 2 (two) times daily. 04/05/14  Yes Historical Provider, MD  pravastatin (PRAVACHOL) 20 MG tablet Take 20 mg by mouth every evening. 04/05/14  Yes Historical Provider, MD  traMADol-acetaminophen (ULTRACET) 37.5-325 MG per tablet Take 1 tablet by mouth every 6 (  six) hours as needed (pain).   Yes Historical Provider, MD  VENTOLIN HFA 108 (90 BASE) MCG/ACT inhaler Inhale 2 puffs into the lungs every 6 (six) hours as needed. 01/08/14  Yes Historical Provider, MD   Allergies  Allergen Reactions  . Keflex [Cephalexin] Other (See Comments)    Unk  .  Penicillins Other (See Comments)    Unk  . Prednisone Other (See Comments)    Unk  . Sulfa Antibiotics Other (See Comments)    Unk    FAMILY HISTORY:  has no family status information on file.  SOCIAL HISTORY:    REVIEW OF SYSTEMS:   Unable   SUBJECTIVE:   VITAL SIGNS: Pulse Rate:  [53-92] 65 (03/12 1713) Resp:  [14-27] 19 (03/12 1713) BP: (128-196)/(49-128) 157/63 mmHg (03/12 1723) SpO2:  [96 %-100 %] 100 % (03/12 1713) Arterial Line BP: (157-162)/(66-70) 162/70 mmHg (03/12 1713) FiO2 (%):  [60 %-100 %] 100 % (03/12 1713) Weight:  [45.36 kg (100 lb)] 45.36 kg (100 lb) (03/12 1723) HEMODYNAMICS:   VENTILATOR SETTINGS: Vent Mode:  [-] PRVC FiO2 (%):  [60 %-100 %] 100 % Set Rate:  [14 bmp] 14 bmp Vt Set:  [400 mL] 400 mL PEEP:  [5 cmH20] 5 cmH20 Plateau Pressure:  [13 cmH20] 13 cmH20 INTAKE / OUTPUT:  Intake/Output Summary (Last 24 hours) at 04/10/14 1731 Last data filed at 04/10/14 1715  Gross per 24 hour  Intake    800 ml  Output   1525 ml  Net   -725 ml    PHYSICAL EXAMINATION: General:  Sedated on vent  Neuro:  RAS -4, still residual NMB  HEENT:  Orally intubated Cardiovascular:  Reg irreg w/ pauses on tele  Lungs:  clear Abdomen:  Soft  Musculoskeletal:  Intact  Skin:  intact  LABS:  CBC  Recent Labs Lab 04/10/14 1148 04/10/14 1157  WBC 5.4  --   HGB 11.3* 12.6  HCT 34.3* 37.0  PLT 171  --    Coag's  Recent Labs Lab 04/10/14 1148  APTT 28  INR 1.03   BMET  Recent Labs Lab 04/10/14 1148 04/10/14 1157  NA 140 140  K 4.3 4.2  CL 101 102  CO2 27  --   BUN 22 25*  CREATININE 0.93 1.00  GLUCOSE 103* 100*   Electrolytes  Recent Labs Lab 04/10/14 1148  CALCIUM 9.8   Sepsis Markers No results for input(s): LATICACIDVEN, PROCALCITON, O2SATVEN in the last 168 hours. ABG No results for input(s): PHART, PCO2ART, PO2ART in the last 168 hours. Liver Enzymes  Recent Labs Lab 04/10/14 1148  AST 26  ALT 16  ALKPHOS 71   BILITOT 0.7  ALBUMIN 3.9   Cardiac Enzymes No results for input(s): TROPONINI, PROBNP in the last 168 hours. Glucose  Recent Labs Lab 04/10/14 1239  GLUCAP 103*    Imaging No results found. ETT good position. Chronic interstitial changes. Rotated film   ASSESSMENT / PLAN:  PULMONARY OETT 3/12>>> A: acute respiratory failure s/p CVA  P:   Full vent support  PAD protocol  F/u ABG Daily assessment for weaning after cleared by IR   CARDIOVASCULAR CVL  A:  H/o afib  HTN  P:  Tele  SBP goal  120-140-->will use hydralazine   RENAL A:   No acute but at risk for AKI from dye load.  P:   F/u chemistry in am   GASTROINTESTINAL A:  No acute  P:   PPI for SUP  HEMATOLOGIC A:   No acute but s/p TPA  P:  Trend cbc Limit lab sticks  Transfuse per ICU protocol    INFECTIOUS A:  No evidence of infection  P:   Trend WBC and fever curve   ENDOCRINE A:   No h/o DM  P:   Trend glucose q4  NEUROLOGIC A:   Acute MCA CVA s/p systemic TPA and endovascular intervention w/ Intra-arterial TPA and retrieval device.  Left PCOM aneurysm (to be addressed depending on outcome of above) P:   RASS goal: -2 PAD protocol  SBP goal 120-140 Further imaging per Neuro.    TODAY'S SUMMARY:  81 year old female admitted [facility]12 s/p acute left MCA territory CVA. She is now s/p systemic TPA and in IR. Will cont full vent support; initiate VAP prevention protocol, and PAD protocol. Will hold off on weaning until f/u imaging obtained and cleared by IR. Son is HCPOA. She is DNR.   Critical care by me: 30 minutes. Including ventilator management, BP control, and collaboration w/ staff re: plan of care.    Simonne Martinet ACNP-BC [facility] Pulmonary/Critical Care Pager # [phone] OR # [phone] if no answer   04/10/2014, 5:31 PM   Billy Fischer, MD ; [facility] [phone].  After 5:30 PM or weekends, call [phone]

## 2014-04-10 NOTE — ED Provider Notes (Signed)
CSN: [id number]     Arrival date & time 04/10/14  1143 History   First MD Initiated Contact with Patient 04/10/14 1157     Chief Complaint  Patient presents with  . Code Stroke     (Consider location/radiation/quality/duration/timing/severity/associated sxs/prior Treatment) HPI Comments: 81 year old female, no known prior medical history, family is not here with the patient to give any information, according to the paramedics the patient was pumping gas at a gas station when she was witnessed to fall against her vehicle, have right-sided facial droop, right-sided hemiparesis. In route to the hospital patient was noted to have mild hypertension, irregularly irregular rhythm, did not have any change in her mental status or her function.  The patient is unable to give any history, she is unable to speak  The history is provided by the EMS personnel and the patient.    Past Medical History  Diagnosis Date  . Atrial fibrillation    History reviewed. No pertinent past surgical history. History reviewed. No pertinent family history. History  Substance Use Topics  . Smoking status: Not on file  . Smokeless tobacco: Not on file  . Alcohol Use: Not on file   OB History    No data available     Review of Systems  Unable to perform ROS: Acuity of condition      Allergies  Keflex; Penicillins; Prednisone; and Sulfa antibiotics  Home Medications   Prior to Admission medications   Medication Sig Start Date End Date Taking? Authorizing Provider  aspirin 325 MG tablet Take 325 mg by mouth daily.   Yes Historical Provider, MD  beta carotene w/minerals (OCUVITE) tablet Take 1 tablet by mouth daily.   Yes Historical Provider, MD  DIGOX 125 MCG tablet Take 125 mcg by mouth daily. 03/15/14  Yes Historical Provider, MD  furosemide (LASIX) 40 MG tablet Take 60 mg by mouth 2 (two) times daily. 03/29/14  Yes Historical Provider, MD  lisinopril (PRINIVIL,ZESTRIL) 2.5 MG tablet Take 2.5 mg by  mouth daily. 03/15/14  Yes Historical Provider, MD  methimazole (TAPAZOLE) 5 MG tablet Take 5 mg by mouth daily. 03/22/14  Yes Historical Provider, MD  metoprolol (LOPRESSOR) 100 MG tablet Take 50 mg by mouth 2 (two) times daily. 03/22/14  Yes Historical Provider, MD  omeprazole (PRILOSEC) 40 MG capsule Take 40 mg by mouth daily. 03/01/14  Yes Historical Provider, MD  potassium chloride SA (K-DUR,KLOR-CON) 20 MEQ tablet Take 30 mEq by mouth 2 (two) times daily. 04/05/14  Yes Historical Provider, MD  pravastatin (PRAVACHOL) 20 MG tablet Take 20 mg by mouth every evening. 04/05/14  Yes Historical Provider, MD  traMADol-acetaminophen (ULTRACET) 37.5-325 MG per tablet Take 1 tablet by mouth every 6 (six) hours as needed (pain).   Yes Historical Provider, MD  VENTOLIN HFA 108 (90 BASE) MCG/ACT inhaler Inhale 2 puffs into the lungs every 6 (six) hours as needed. 01/08/14  Yes Historical Provider, MD   BP 140/101 mmHg  Pulse 64  Resp 15  Ht  (1.575 m)  SpO2 99%  LMP  (LMP Unknown) Physical Exam  Constitutional: She appears well-developed and well-nourished. She appears distressed.  HENT:  Head: Normocephalic and atraumatic.  Mouth/Throat: Oropharynx is clear and moist. No oropharyngeal exudate.  Eyes: Conjunctivae and EOM are normal. Pupils are equal, round, and reactive to light. Right eye exhibits no discharge. Left eye exhibits no discharge. No scleral icterus.  Neck: Normal range of motion. Neck supple. No JVD present. No thyromegaly present.  Cardiovascular:  Normal rate, normal heart sounds and intact distal pulses.  Exam reveals no gallop and no friction rub.   No murmur heard. Irregularly irregular rhythm  Pulmonary/Chest: Effort normal and breath sounds normal. No respiratory distress. She has no wheezes. She has no rales.  Decreased respiratory effort, no gag reflex  Abdominal: Soft. Bowel sounds are normal. She exhibits no distension and no mass. There is no tenderness.  Musculoskeletal:  Normal range of motion. She exhibits no edema or tenderness.  Lymphadenopathy:    She has no cervical adenopathy.  Neurological: She is alert.  Right upper extremity hemiparetic, right lower extremity with some tone, upward going toe on the right, downward on the left, pupils equal round and reactive to light, right-sided hemi-neglect is present, cranial nerves III through XII significant for right facial droop, right upper extremity with complete paresis, left upper extremity with normal grip, normal coordination  Skin: Skin is warm and dry. No rash noted. No erythema.  Psychiatric: She has a normal mood and affect. Her behavior is normal.  Nursing note and vitals reviewed.   ED Course  Procedures (including critical care time) Labs Review Labs Reviewed  CBC - Abnormal; Notable for the following:    Hemoglobin 11.3 (*)    HCT 34.3 (*)    All other components within normal limits  COMPREHENSIVE METABOLIC PANEL - Abnormal; Notable for the following:    Glucose, Bld 103 (*)    GFR calc non Af Amer 54 (*)    GFR calc Af Amer 63 (*)    All other components within normal limits  CBG MONITORING, ED - Abnormal; Notable for the following:    Glucose-Capillary 103 (*)    All other components within normal limits  I-STAT TROPOININ, ED - Abnormal; Notable for the following:    Troponin i, poc 0.11 (*)    All other components within normal limits  I-STAT CHEM 8, ED - Abnormal; Notable for the following:    BUN 25 (*)    Glucose, Bld 100 (*)    All other components within normal limits  PROTIME-INR  APTT  DIFFERENTIAL    Imaging Review Ct Angio Head W/cm &/or Wo Cm  04/10/2014   CLINICAL DATA:  Right-sided facial weakness and droop. Right-sided weakness. Sudden onset today. The patient fell on to the foot of her call are.  EXAM: CT ANGIOGRAPHY HEAD AND NECK  TECHNIQUE: Multidetector CT imaging of the head and neck was performed using the standard protocol during bolus administration of  intravenous contrast. Multiplanar CT image reconstructions and MIPs were obtained to evaluate the vascular anatomy. Carotid stenosis measurements (when applicable) are obtained utilizing NASCET criteria, using the distal internal carotid diameter as the denominator.  CONTRAST:  50mL OMNIPAQUE IOHEXOL 350 MG/ML SOLN  COMPARISON:  None.  FINDINGS: CT HEAD  Brain: The patient's head is somewhat oblique in the scanner. Indistinct gray-white differentiation is present in the anterior left frontal lobe above the level the ventricles and a superiorly. There is some indistinctness in the posterior left insular cortex as well. The basal ganglia are intact bilaterally. Extensive periventricular and subcortical white matter hypoattenuation is evident bilaterally. Ventricular size is proportionate to the degree of atrophy. Atherosclerotic calcifications are present within the cavernous internal carotid arteries and at the dural margin of the vertebral arteries  Calvarium and skull base: Extensive metallic artifact is noted from a mandibular prosthesis. The calvarium is intact.  Paranasal sinuses: Clear  Orbits: Unremarkable.  CTA NECK  Aortic arch:  There is a common origin of the left common carotid artery in the innominate artery. Dense atherosclerotic calcifications are present in the aortic arch the and origins the great vessels without a significant stenosis.  Right carotid system: The right common carotid artery is within normal limits. Dense atherosclerotic calcifications are present within the proximal right internal carotid artery without a significant stenosis of greater than 50% relative to the more distal vessel. There is moderate tortuosity of the cervical right ICA without a significant stenosis.  Left carotid system: The left common carotid artery is tortuous proximally with atherosclerotic calcifications but no significant stenoses. Dense atherosclerotic calcifications are evident at the proximal left internal  carotid artery without a significant stenosis. There is marked tortuosity of the cervical left ICA.  Vertebral arteries:Both vertebral arteries originate from the subclavian arteries. There is moderate tortuosity without a significant stenosis. The right vertebral artery is slightly dominant to the left.  Skeleton: Levoconvex curvature is present in the upper thoracic spine with compensatory rightward curvature in the cervical spine. Advanced facet changes are noted. Endplate changes are most pronounced on the left at C3-4 with osseous foraminal narrowing. No focal lytic or blastic lesions are present.  Other neck: A thyroid goiter is more pronounced on the right with heterogeneous density and scattered calcifications. No dominant lesions are present. The goiter extends 6.5 mm cephalo caudad.  No other significant soft tissue lesions are present in the neck or upper mediastinum.  CTA HEAD  Anterior circulation: Atherosclerotic calcifications are present within the cavernous internal carotid arteries bilaterally without a significant stenosis. 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 The right M1 segment is normal. The right A1 segment is aplastic. Both ACA vessels fill from the left. The proximal left M1 segment is unremarkable. The left MCA is occluded at the bifurcation with filling of only the most anterior branch. There is marked attenuation of an inferior posterior branch. The other MCA branch vessels are occluded with some pial collaterals.  Posterior circulation: The right vertebral artery is the dominant vessel. From the left vertebral artery bifurcates at the PICA ascending a small branch to the vertebrobasilar junction. A focal calcification is present at the vertebrobasilar junction. The right PICA is not visualized. The right AICA is dominant. Both posterior cerebral arteries originate from the basilar tip. The PCA branch vessels are intact.  Venous  sinuses: The dural sinuses are patent.  Anatomic variants: None  Delayed phase: No pathologic enhancement is evident.  IMPRESSION: 1. Occlusion of the left middle cerebral artery at the bifurcation with opacification of only the most anterior MCA branch vessels. 2. Some pial collaterals are evident. 3. Evidence of early infarct involving the anterior left MCA distribution with cortical indistinctness in decreased cortical definition in the posterior left insular cortex. The basal ganglia are intact. 4.  ASPECTS score = 7/10 5. Marked diffuse white matter hypoattenuation is evident bilaterally, compatible with chronic microvascular ischemia. 6. Extensive atherosclerotic changes involving with the carotid bifurcations bilaterally and cavernous internal carotid arteries without significant stenoses. 7. Moderate tortuosity throughout the neck. 8. Prominent thyroid goiter, more evident on the right. 9. Scoliosis. These results were called by telephone at the time of interpretation on 04/10/2014 at 12:10 pm to Dr. Leroy Kennedy , who verbally acknowledged these results.   Electronically Signed   By: Marin Roberts M.D.   On: 04/10/2014 12:50   Ct Angio Neck W/cm &/or Wo/cm  04/10/2014   CLINICAL DATA:  Right-sided  facial weakness and droop. Right-sided weakness. Sudden onset today. The patient fell on to the foot of her call are.  EXAM: CT ANGIOGRAPHY HEAD AND NECK  TECHNIQUE: Multidetector CT imaging of the head and neck was performed using the standard protocol during bolus administration of intravenous contrast. Multiplanar CT image reconstructions and MIPs were obtained to evaluate the vascular anatomy. Carotid stenosis measurements (when applicable) are obtained utilizing NASCET criteria, using the distal internal carotid diameter as the denominator.  CONTRAST:  50mL OMNIPAQUE IOHEXOL 350 MG/ML SOLN  COMPARISON:  None.  FINDINGS: CT HEAD  Brain: The patient's head is somewhat oblique in the scanner. Indistinct  gray-white differentiation is present in the anterior left frontal lobe above the level the ventricles and a superiorly. There is some indistinctness in the posterior left insular cortex as well. The basal ganglia are intact bilaterally. Extensive periventricular and subcortical white matter hypoattenuation is evident bilaterally. Ventricular size is proportionate to the degree of atrophy. Atherosclerotic calcifications are present within the cavernous internal carotid arteries and at the dural margin of the vertebral arteries  Calvarium and skull base: Extensive metallic artifact is noted from a mandibular prosthesis. The calvarium is intact.  Paranasal sinuses: Clear  Orbits: Unremarkable.  CTA NECK  Aortic arch: There is a common origin of the left common carotid artery in the innominate artery. Dense atherosclerotic calcifications are present in the aortic arch the and origins the great vessels without a significant stenosis.  Right carotid system: The right common carotid artery is within normal limits. Dense atherosclerotic calcifications are present within the proximal right internal carotid artery without a significant stenosis of greater than 50% relative to the more distal vessel. There is moderate tortuosity of the cervical right ICA without a significant stenosis.  Left carotid system: The left common carotid artery is tortuous proximally with atherosclerotic calcifications but no significant stenoses. Dense atherosclerotic calcifications are evident at the proximal left internal carotid artery without a significant stenosis. There is marked tortuosity of the cervical left ICA.  Vertebral arteries:Both vertebral arteries originate from the subclavian arteries. There is moderate tortuosity without a significant stenosis. The right vertebral artery is slightly dominant to the left.  Skeleton: Levoconvex curvature is present in the upper thoracic spine with compensatory rightward curvature in the cervical  spine. Advanced facet changes are noted. Endplate changes are most pronounced on the left at C3-4 with osseous foraminal narrowing. No focal lytic or blastic lesions are present.  Other neck: A thyroid goiter is more pronounced on the right with heterogeneous density and scattered calcifications. No dominant lesions are present. The goiter extends 6.5 mm cephalo caudad.  No other significant soft tissue lesions are present in the neck or upper mediastinum.  CTA HEAD  Anterior circulation: Atherosclerotic calcifications are present within the cavernous internal carotid arteries bilaterally without a significant stenosis. 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 The right M1 segment is normal. The right A1 segment is aplastic. Both ACA vessels fill from the left. The proximal left M1 segment is unremarkable. The left MCA is occluded at the bifurcation with filling of only the most anterior branch. There is marked attenuation of an inferior posterior branch. The other MCA branch vessels are occluded with some pial collaterals.  Posterior circulation: The right vertebral artery is the dominant vessel. From the left vertebral artery bifurcates at the PICA ascending a small branch to the vertebrobasilar junction. A focal calcification is present at the vertebrobasilar junction. The  right PICA is not visualized. The right AICA is dominant. Both posterior cerebral arteries originate from the basilar tip. The PCA branch vessels are intact.  Venous sinuses: The dural sinuses are patent.  Anatomic variants: None  Delayed phase: No pathologic enhancement is evident.  IMPRESSION: 1. Occlusion of the left middle cerebral artery at the bifurcation with opacification of only the most anterior MCA branch vessels. 2. Some pial collaterals are evident. 3. Evidence of early infarct involving the anterior left MCA distribution with cortical indistinctness in decreased cortical  definition in the posterior left insular cortex. The basal ganglia are intact. 4.  ASPECTS score = 7/10 5. Marked diffuse white matter hypoattenuation is evident bilaterally, compatible with chronic microvascular ischemia. 6. Extensive atherosclerotic changes involving with the carotid bifurcations bilaterally and cavernous internal carotid arteries without significant stenoses. 7. Moderate tortuosity throughout the neck. 8. Prominent thyroid goiter, more evident on the right. 9. Scoliosis. These results were called by telephone at the time of interpretation on 04/10/2014 at 12:10 pm to Dr. Leroy KennedyAMILO , who verbally acknowledged these results.   Electronically Signed   By: Marin Robertshristopher  Mattern M.D.   On: 04/10/2014 12:50   Dg Chest Port 1 View  04/10/2014   CLINICAL DATA:  Intubation, atrial fibrillation ; LEFT facial droop, flaccid LEFT arm, aphasic, fell while pumping gas at gas station  EXAM: PORTABLE CHEST - 1 VIEW  COMPARISON:  Portable exam 1238 hr compared to 01/06/2014  FINDINGS: Tip of endotracheal tube projects 1.1 cm above carina.  Enlargement of cardiac silhouette post TAVR.  Rotation to the RIGHT.  Tortuous aorta with atherosclerotic calcification.  No definite acute infiltrate, pleural effusion or pneumothorax.  Slight chronic accentuation of interstitial markings not significantly changed from previous exam.  Bones demineralized.  IMPRESSION: Enlargement of cardiac silhouette post TAVR.  Mild chronic interstitial changes without acute infiltrate.   Electronically Signed   By: Ulyses SouthwardMark  Boles M.D.   On: 04/10/2014 13:25     EKG Interpretation   Date/Time:  Saturday April 10 2014 12:13:59 EST Ventricular Rate:  73 PR Interval:    QRS Duration: 124 QT Interval:  376 QTC Calculation: 414 R Axis:   71 Text Interpretation:  Atrial fibrillation LVH with secondary  repolarization abnormality Anterior Q waves, possibly due to LVH ST  depression, consider ischemia, diffuse lds Abnormal ekg No old  tracing to  compare Confirmed by Darolyn Double  MD, Javonnie Illescas ([id number]) on 04/10/2014 1:18:22 PM      MDM   Final diagnoses:  Endotracheal tube present  Acute ischemic stroke    The patient does have a significant stroke, appears to be a left-sided involvement, neurology at the bedside, recommend intubation as the patient has respiratory compromise, no gag reflex and will be getting thrombolytic therapy. Family members have not yet arrived to corroborate prior history, anticipate thrombolytics if no contraindications, this has been ordered by neurology, intubated without difficulty 1 attempt, 2 peripheral IVs have been placed, patient is critically ill with severe stroke  CRITICAL CARE Performed by: Vida RollerMILLER,Kynley Metzger D Total critical care time: 35 Critical care time was exclusive of separately billable procedures and treating other patients. Critical care was necessary to treat or prevent imminent or life-threatening deterioration. Critical care was time spent personally by me on the following activities: development of treatment plan with patient and/or surrogate as well as nursing, discussions with consultants, evaluation of patient's response to treatment, examination of patient, obtaining history from patient or surrogate, ordering and performing treatments and interventions, ordering  and review of laboratory studies, ordering and review of radiographic studies, pulse oximetry and re-evaluation of patient's condition.     INTUBATION Performed by: Vida Roller  Required items: required blood products, implants, devices, and special equipment available Patient identity confirmed: provided demographic data and hospital-assigned identification number Time out: Immediately prior to procedure a "time out" was called to verify the correct patient, procedure, equipment, support staff and site/side marked as required.  Indications: Respiratory compromise, large stroke   Intubation method: Direct  laryngoscopy    Preoxygenation: BVM  Sedatives: 10 mg Etomidate Paralytic: 100 mg Succinylcholine  Tube Size: 7-1/2 cuffed  Post-procedure assessment: chest rise and ETCO2 monitor Breath sounds: equal and absent over the epigastrium Tube secured with: ETT holder Chest x-ray interpreted by radiologist and me.  Chest x-ray findings: endotracheal tube in appropriate position  Patient tolerated the procedure well with no immediate complications.   Pt received thrombolytics and will be admitted to the ICU.    Eber Hong, MD 04/10/14 1556

## 2014-04-10 NOTE — Progress Notes (Signed)
1st puncture- 1345 Revascularization- 1530
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
## 2014-04-10 NOTE — Procedures (Signed)
S/P 4 vessel cerebral arteriogram followed by near complete revascularization of occluded Lt MCA mid M1 segment with X2 passes of 4mm x 30 mm Trevoprovue retrieval device and 8mg  of  Superselective intracranial TPA with establishment of TICI 2B flow  In the LT MCA distribution.  Also presence of a 6.7 mm x 4.6 mm Lt PCOM aneurysym..  I Will address this  later depending on patients outcome

## 2014-04-10 NOTE — Transfer of Care (Signed)
Immediate Anesthesia Transfer of Care Note  Patient: [name] J Whitelock  Procedure(s) Performed: Procedure(s): RADIOLOGY WITH ANESTHESIA (N/A)  Patient Location: NICU  Anesthesia Type:General  Level of Consciousness: sedated and Patient remains intubated per anesthesia plan  Airway & Oxygen Therapy: Patient remains intubated per anesthesia plan and Patient placed on Ventilator (see vital sign flow sheet for setting)  Post-op Assessment: Report given to RN and Post -op Vital signs reviewed and stable  Post vital signs: Reviewed and stable  Last Vitals:  Filed Vitals:   04/10/14 1325  BP: 140/101  Pulse: 64  Resp: 15    Complications: No apparent anesthesia complications

## 2014-04-10 NOTE — ED Notes (Signed)
  CBG 103  
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
## 2014-04-10 NOTE — Anesthesia Postprocedure Evaluation (Signed)
  Anesthesia Post-op Note  Patient: [name]  Procedure(s) Performed: Procedure(s): RADIOLOGY WITH ANESTHESIA (N/A)  Patient Location: SICU  Anesthesia Type:General  Level of Consciousness: sedated and Patient remains intubated per anesthesia plan  Airway and Oxygen Therapy: Patient remains intubated per anesthesia plan and Patient placed on Ventilator (see vital sign flow sheet for setting)  Post-op Pain: none  Post-op Assessment: Post-op Vital signs reviewed, Patient's Cardiovascular Status Stable, Respiratory Function Stable, Patent Airway, No signs of Nausea or vomiting and Pain level controlled  Post-op Vital Signs: stable  Last Vitals:  Filed Vitals:   04/10/14 1325  BP: 140/101  Pulse: 64  Resp: 15    Complications: No apparent anesthesia complications

## 2014-04-11 ENCOUNTER — Encounter (HOSPITAL_COMMUNITY): Payer: Self-pay | Admitting: Interventional Radiology

## 2014-04-11 ENCOUNTER — Inpatient Hospital Stay (HOSPITAL_COMMUNITY): Payer: Medicare Other

## 2014-04-11 DIAGNOSIS — I639 Cerebral infarction, unspecified: Secondary | ICD-10-CM

## 2014-04-11 LAB — CBC WITH DIFFERENTIAL/PLATELET
Basophils Absolute: 0 10*3/uL (ref 0.0–0.1)
Basophils Relative: 0 % (ref 0–1)
Eosinophils Absolute: 0 10*3/uL (ref 0.0–0.7)
Eosinophils Relative: 1 % (ref 0–5)
HCT: 30.4 % — ABNORMAL LOW (ref 36.0–46.0)
Hemoglobin: 9.9 g/dL — ABNORMAL LOW (ref 12.0–15.0)
Lymphocytes Relative: 14 % (ref 12–46)
Lymphs Abs: 0.9 10*3/uL (ref 0.7–4.0)
MCH: 26.5 pg (ref 26.0–34.0)
MCHC: 32.6 g/dL (ref 30.0–36.0)
MCV: 81.3 fL (ref 78.0–100.0)
Monocytes Absolute: 0.8 10*3/uL (ref 0.1–1.0)
Monocytes Relative: 12 % (ref 3–12)
Neutro Abs: 4.9 10*3/uL (ref 1.7–7.7)
Neutrophils Relative %: 74 % (ref 43–77)
Platelets: 148 10*3/uL — ABNORMAL LOW (ref 150–400)
RBC: 3.74 MIL/uL — ABNORMAL LOW (ref 3.87–5.11)
RDW: 14.6 % (ref 11.5–15.5)
WBC: 6.7 10*3/uL (ref 4.0–10.5)

## 2014-04-11 LAB — GLUCOSE, CAPILLARY
GLUCOSE-CAPILLARY: 157 mg/dL — AB (ref 70–99)
Glucose-Capillary: 148 mg/dL — ABNORMAL HIGH (ref 70–99)
Glucose-Capillary: 136 mg/dL — ABNORMAL HIGH (ref 70–99)
Glucose-Capillary: 159 mg/dL — ABNORMAL HIGH (ref 70–99)
Glucose-Capillary: 123 mg/dL — ABNORMAL HIGH (ref 70–99)
Glucose-Capillary: 114 mg/dL — ABNORMAL HIGH (ref 70–99)

## 2014-04-11 LAB — LIPID PANEL
CHOL/HDL RATIO: 4.7 ratio
Cholesterol: 127 mg/dL (ref 0–200)
HDL: 27 mg/dL — AB (ref >39)
LDL Cholesterol: 77 mg/dL (ref 0–99)
Triglycerides: 117 mg/dL (ref <150)
VLDL: 23 mg/dL (ref 0–40)

## 2014-04-11 LAB — COMPREHENSIVE METABOLIC PANEL
ALBUMIN: 2.9 g/dL — AB (ref 3.5–5.2)
ALT: 24 U/L (ref 0–35)
AST: 32 U/L (ref 0–37)
Alkaline Phosphatase: 62 U/L (ref 39–117)
Anion gap: 7 (ref 5–15)
BUN: 11 mg/dL (ref 6–23)
CHLORIDE: 110 mmol/L (ref 96–112)
CO2: 24 mmol/L (ref 19–32)
Calcium: 8.2 mg/dL — ABNORMAL LOW (ref 8.4–10.5)
Creatinine, Ser: 0.62 mg/dL (ref 0.50–1.10)
GFR calc non Af Amer: 80 mL/min — ABNORMAL LOW (ref >90)
Glucose, Bld: 154 mg/dL — ABNORMAL HIGH (ref 70–99)
Potassium: 2.9 mmol/L — ABNORMAL LOW (ref 3.5–5.1)
SODIUM: 141 mmol/L (ref 135–145)
TOTAL PROTEIN: 5.6 g/dL — AB (ref 6.0–8.3)
Total Bilirubin: 0.6 mg/dL (ref 0.3–1.2)

## 2014-04-11 LAB — BASIC METABOLIC PANEL
ANION GAP: 7 (ref 5–15)
BUN: 12 mg/dL (ref 6–23)
CALCIUM: 8.9 mg/dL (ref 8.4–10.5)
CHLORIDE: 112 mmol/L (ref 96–112)
CO2: 22 mmol/L (ref 19–32)
CREATININE: 0.66 mg/dL (ref 0.50–1.10)
GFR calc non Af Amer: 78 mL/min — ABNORMAL LOW (ref >90)
GLUCOSE: 135 mg/dL — AB (ref 70–99)
Potassium: 3.6 mmol/L (ref 3.5–5.1)
Sodium: 141 mmol/L (ref 135–145)

## 2014-04-11 LAB — POCT I-STAT 3, ART BLOOD GAS (G3+)
Acid-base deficit: 1 mmol/L (ref 0.0–2.0)
Bicarbonate: 22.9 mEq/L (ref 20.0–24.0)
O2 Saturation: 99 %
Patient temperature: 98
TCO2: 24 mmol/L (ref 0–100)
pCO2 arterial: 34.6 mmHg — ABNORMAL LOW (ref 35.0–45.0)
pH, Arterial: 7.428 (ref 7.350–7.450)
pO2, Arterial: 151 mmHg — ABNORMAL HIGH (ref 80.0–100.0)

## 2014-04-11 MED ORDER — POTASSIUM CHLORIDE 20 MEQ/15ML (10%) PO SOLN
ORAL | Status: AC
  Filled 2014-04-11: qty 15

## 2014-04-11 MED ORDER — FENTANYL CITRATE 0.05 MG/ML IJ SOLN: 25.0000 ug | INTRAMUSCULAR | Status: DC | PRN

## 2014-04-11 MED ORDER — POTASSIUM CHLORIDE 20 MEQ/15ML (10%) PO SOLN
40.0000 meq | ORAL | Status: AC
  Administered 2014-04-11 (×2): 40 meq
  Filled 2014-04-11: qty 30

## 2014-04-11 MED ORDER — ASPIRIN 300 MG RE SUPP
300.0000 mg | Freq: Once | RECTAL | Status: AC
  Administered 2014-04-11: 300 mg via RECTAL
  Filled 2014-04-11: qty 1

## 2014-04-11 MED ORDER — PRAVASTATIN SODIUM 40 MG PO TABS
40.0000 mg | ORAL_TABLET | Freq: Every day | ORAL | Status: DC
  Administered 2014-04-12 – 2014-04-13 (×2): 40 mg
  Filled 2014-04-11 (×3): qty 1
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
## 2014-04-11 NOTE — Progress Notes (Signed)
Pt had 8 beats of V. Tach. Dr. Roseanne RenoStewart made aware.

## 2014-04-11 NOTE — Progress Notes (Signed)
PULMONARY / CRITICAL CARE MEDICINE   Name: [name] MRN: [id number] DOB: 10/07/1926    ADMISSION DATE:  04/10/2014 CONSULTATION DATE:  3/12  REFERRING MD :  Leroy Kennedy   CHIEF COMPLAINT:  Vent management s/p CVA   INITIAL PRESENTATION:  81 year old female admitted on 3/12 s/p left MCA territory CVA. Was given systemic TPA in ER then went to IR for endovascular intervention which included: intra-arterial TPA and clot retrieval. Returned to ICU on vent.  PCCM was asked to help w/ post op vent management   STUDIES:  CT angio 3/12:  Occlusion of the left middle cerebral artery at the bifurcation with opacification of only the most anterior MCA branch vessels. Some pial collaterals are evident. Evidence of early infarct involving the anterior left MCA distribution with cortical indistinctness in decreased cortical definition in the posterior left insular cortex. The basal ganglia are intact. Marked diffuse white matter hypoattenuation is evident bilaterally, compatible with chronic microvascular ischemia.Extensive atherosclerotic changes involving with the carotid bifurcations bilaterally and cavernous internal carotid arteries without significant stenoses. Moderate tortuosity throughout the neck.Prominent thyroid goiter, more evident on the right. Scoliosis.  SIGNIFICANT EVENTS: 3/12 TPA in ER then went to IR for Endovascular  3/12: S/P 4 vessel cerebral arteriogram followed by near complete revascularization of occluded Lt MCA mid M1 segment with X2 passes of 4mm x 30 mm Trevoprovue retrieval device and  of Superselective intracranial TPA with establishment of TICI 2B flow In the LT MCA distribution   SUBJECTIVE:  Passed SBT. Extubated. Comfortable. Appears aphasic  VITAL SIGNS: Temp:  [94.1 F (34.5 C)-98.7 F (37.1 C)] 98.7 F (37.1 C) (03/13 1200) Pulse Rate:  [28-92] 79 (03/13 1700) Resp:  [12-26] 23 (03/13 1700) BP: (109-179)/(45-82) 141/64 mmHg (03/13 1700) SpO2:  [99  %-100 %] 100 % (03/13 1700) Arterial Line BP: (112-155)/(45-66) 151/59 mmHg (03/13 1700) FiO2 (%):  [40 %-60 %] 40 % (03/13 1630) Weight:  [45.36 kg (100 lb)] 45.36 kg (100 lb) (03/13 1521) HEMODYNAMICS:   VENTILATOR SETTINGS: Vent Mode:  [-] PSV;CPAP FiO2 (%):  [40 %-60 %] 40 % Set Rate:  [16 bmp] 16 bmp Vt Set:  [400 mL] 400 mL PEEP:  [5 cmH20] 5 cmH20 Pressure Support:  [5 cmH20-10 cmH20] 5 cmH20 Plateau Pressure:  [10 cmH20-18 cmH20] 10 cmH20 INTAKE / OUTPUT:  Intake/Output Summary (Last 24 hours) at 04/11/14 1849 Last data filed at 04/11/14 1700  Gross per 24 hour  Intake 2004.05 ml  Output   1840 ml  Net 164.05 ml    PHYSICAL EXAMINATION: General:  NAD Neuro: R hemiplegia  HEENT:  NCAT, R facial droop Cardiovascular: IR, no M Lungs:  clear Abdomen:  Soft  Ext: no edema   LABS: I have reviewed all of today's lab results. Relevant abnormalities are discussed in the A/P section  CXR: NAD  ASSESSMENT / PLAN:  PULMONARY OETT 3/12>>>3/13 A: acute respiratory failure s/p CVA  P:   Monitor in ICU post extubation Supp O2 as needed  CARDIOVASCULAR A:  AF, controlled rate  HTN  P:  Cont current Rx  RENAL A:   No issues P:   Monitor BMET intermittently Monitor I/Os Correct electrolytes as indicated  GASTROINTESTINAL A:   Dysphagia Chronic PPI use P:   SUP IV PPI SLP eval - NPO until then   HEMATOLOGIC A:   No issues P:  DVT px: SQ heparin Monitor CBC intermittently Transfuse per usual ICU guidelines  INFECTIOUS A:  No evidence of  infection  P:   Monitor temp, WBC count Micro and abx as above  ENDOCRINE A:   No h/o DM  P:   Trend glucose q4  NEUROLOGIC A:   Acute MCA CVA P:   Mgmt per Stroke team     Billy Fischeravid Simonds, MD ; [facility] Mobile [phone].  After 5:30 PM or weekends, call [phone]

## 2014-04-11 NOTE — Progress Notes (Signed)
Pulled back ETT 3cm per MD order.  Bilateral breath sounds noted.  Will continue to monitor.

## 2014-04-11 NOTE — Progress Notes (Signed)
Rt 9 Fr sheath [facility] 0846[facility].  Upon inspection, the sheath was kinked.  V Pad and manual pressure applied.  Hemostasis obtained at  0910.  No complications. Distal pulses evaluated.  Pressure dressing and sandbag applied. Site reviewed with NeurosurgeonMegan RN.

## 2014-04-11 NOTE — Progress Notes (Signed)
Patient transported to MRI and back to room 3M06 without any complications.

## 2014-04-11 NOTE — Procedures (Signed)
Extubation Procedure Note  Patient Details:   Name: [name] DOB: Mar 18, 1926 MRN: [id number]   Airway Documentation:     Evaluation  O2 sats: stable throughout Complications: No apparent complications Patient did tolerate procedure well. Bilateral Breath Sounds: Clear Suctioning: Airway No   Patient extubated to 2L nasal cannula per MD order.  Positive cuff leak noted.  No evidence of stridor.  Sats currently 100%.  Vitals are stable.  Patient unable to speak possibly for effects of stroke.  No apparent complications.  Durwin GlazeBrown, Lizzett Nobile N 04/11/2014, 5:04 PM

## 2014-04-11 NOTE — Progress Notes (Signed)
[facility] FOR AM LAB REPLACEMENT ONLY  The patient does apply for the [facility] Electrolyte Replacment Protocol based on the criteria listed below:   1. Is GFR >/= 40 ml/min? Yes.    Patient's GFR today is 80 2. Is urine output >/= 0.5 ml/kg/hr for the last 6 hours? Yes.   Patient's UOP is 1.7 ml/kg/hr 3. Is BUN < 60 mg/dL? Yes.    Patient's BUN today is 11 4. Abnormal electrolyte(s): K 2.9 5. Ordered repletion with: per protocol 6. If a panic level lab has been reported, has the CCM MD in charge been notified? No..   Physician:    Markus DaftWHELAN, Zakhari Fogel A 04/11/2014 6:29 AM

## 2014-04-11 NOTE — Progress Notes (Signed)
Performed q30 minute groin site check at 1100. Dorsalis pedis pulse was 2+. Dressing at groin was saturated with blood. Held pressure and paged Dr. Corliss Skainseveshwar. Jeananne RamaKevin Allred, PA was on the unit and able to assess the site. Pressure was held for 25 minutes and a new V pad and pressure dressing was placed. MRI held due to instability of groin site per Dr. Corliss Skainseveshwar. Will continue to monitor site and update as needed.

## 2014-04-11 NOTE — Progress Notes (Addendum)
Patient ID: [name], female   DOB: 10/22/1926, 81 y.o.   MRN: [id number]      Subjective:  Pt intubated, sedated; had recent episode of bleeding from rt groin /CFA puncture site post sheath removal this am; new V pad placed and pressure held for 25 mins with hemostasis- sandbag placed over site  Allergies: Keflex; Penicillins; Prednisone; and Sulfa antibiotics  Medications: Prior to Admission medications   Medication Sig Start Date End Date Taking? Authorizing Provider  aspirin 325 MG tablet Take 325 mg by mouth daily.   Yes Historical Provider, MD  beta carotene w/minerals (OCUVITE) tablet Take 1 tablet by mouth daily.   Yes Historical Provider, MD  DIGOX 125 MCG tablet Take 125 mcg by mouth daily. 03/15/14  Yes Historical Provider, MD  furosemide (LASIX) 40 MG tablet Take 60 mg by mouth 2 (two) times daily. 03/29/14  Yes Historical Provider, MD  lisinopril (PRINIVIL,ZESTRIL) 2.5 MG tablet Take 2.5 mg by mouth daily. 03/15/14  Yes Historical Provider, MD  methimazole (TAPAZOLE) 5 MG tablet Take 5 mg by mouth daily. 03/22/14  Yes Historical Provider, MD  metoprolol (LOPRESSOR) 100 MG tablet Take 50 mg by mouth 2 (two) times daily. 03/22/14  Yes Historical Provider, MD  omeprazole (PRILOSEC) 40 MG capsule Take 40 mg by mouth daily. 03/01/14  Yes Historical Provider, MD  potassium chloride SA (K-DUR,KLOR-CON) 20 MEQ tablet Take 30 mEq by mouth 2 (two) times daily. 04/05/14  Yes Historical Provider, MD  pravastatin (PRAVACHOL) 20 MG tablet Take 20 mg by mouth every evening. 04/05/14  Yes Historical Provider, MD  traMADol-acetaminophen (ULTRACET) 37.5-325 MG per tablet Take 1 tablet by mouth every 6 (six) hours as needed (pain).   Yes Historical Provider, MD  VENTOLIN HFA 108 (90 BASE) MCG/ACT inhaler Inhale 2 puffs into the lungs every 6 (six) hours as needed. 01/08/14  Yes Historical Provider, MD     Vital Signs: BP 150/47 mmHg  Pulse 67  Temp(Src) 98.7 F (37.1 C) (Axillary)  Resp 22   Ht  (1.575 m)  Wt 100 lb (45.36 kg)  BMI 18.29 kg/m2  SpO2 100%  LMP  (LMP Unknown)  Physical Exam intubated, sedated; does open eyes and follows some simple commands when aroused; PERRL; purposeful movement of LUE/LLE; nonpurposeful with RUE/RLE; pt was noted to have some involuntary muscle spasms in RLE which may have contributed to rebleed from CFA site; intact distal pulses; rt groin site currently stable with sandbag in place Imaging: Ct Angio Head W/cm &/or Wo Cm  04/10/2014   CLINICAL DATA:  Right-sided facial weakness and droop. Right-sided weakness. Sudden onset today. The patient fell on to the foot of her call are.  EXAM: CT ANGIOGRAPHY HEAD AND NECK  TECHNIQUE: Multidetector CT imaging of the head and neck was performed using the standard protocol during bolus administration of intravenous contrast. Multiplanar CT image reconstructions and MIPs were obtained to evaluate the vascular anatomy. Carotid stenosis measurements (when applicable) are obtained utilizing NASCET criteria, using the distal internal carotid diameter as the denominator.  CONTRAST:  50mL OMNIPAQUE IOHEXOL 350 MG/ML SOLN  COMPARISON:  None.  FINDINGS: CT HEAD  Brain: The patient's head is somewhat oblique in the scanner. Indistinct gray-white differentiation is present in the anterior left frontal lobe above the level the ventricles and a superiorly. There is some indistinctness in the posterior left insular cortex as well. The basal ganglia are intact bilaterally. Extensive periventricular and subcortical white matter hypoattenuation is evident bilaterally. Ventricular size  is proportionate to the degree of atrophy. Atherosclerotic calcifications are present within the cavernous internal carotid arteries and at the dural margin of the vertebral arteries  Calvarium and skull base: Extensive metallic artifact is noted from a mandibular prosthesis. The calvarium is intact.  Paranasal sinuses: Clear  Orbits: Unremarkable.   CTA NECK  Aortic arch: There is a common origin of the left common carotid artery in the innominate artery. Dense atherosclerotic calcifications are present in the aortic arch the and origins the great vessels without a significant stenosis.  Right carotid system: The right common carotid artery is within normal limits. Dense atherosclerotic calcifications are present within the proximal right internal carotid artery without a significant stenosis of greater than 50% relative to the more distal vessel. There is moderate tortuosity of the cervical right ICA without a significant stenosis.  Left carotid system: The left common carotid artery is tortuous proximally with atherosclerotic calcifications but no significant stenoses. Dense atherosclerotic calcifications are evident at the proximal left internal carotid artery without a significant stenosis. There is marked tortuosity of the cervical left ICA.  Vertebral arteries:Both vertebral arteries originate from the subclavian arteries. There is moderate tortuosity without a significant stenosis. The right vertebral artery is slightly dominant to the left.  Skeleton: Levoconvex curvature is present in the upper thoracic spine with compensatory rightward curvature in the cervical spine. Advanced facet changes are noted. Endplate changes are most pronounced on the left at C3-4 with osseous foraminal narrowing. No focal lytic or blastic lesions are present.  Other neck: A thyroid goiter is more pronounced on the right with heterogeneous density and scattered calcifications. No dominant lesions are present. The goiter extends 6.5 mm cephalo caudad.  No other significant soft tissue lesions are present in the neck or upper mediastinum.  CTA HEAD  Anterior circulation: Atherosclerotic calcifications are present within the cavernous internal carotid arteries bilaterally without a significant stenosis. An irregular inferiorly directed 7.5 mm left posterior communicating artery  aneurysm is noted. The right M1 segment is normal. The right A1 segment is aplastic. Both ACA vessels fill from the left. The proximal left M1 segment is unremarkable. The left MCA is occluded at the bifurcation with filling of only the most anterior branch. There is marked attenuation of an inferior posterior branch. The other MCA branch vessels are occluded with some pial collaterals.  Posterior circulation: The right vertebral artery is the dominant vessel. From the left vertebral artery bifurcates at the PICA ascending a small branch to the vertebrobasilar junction. A focal calcification is present at the vertebrobasilar junction. The right PICA is not visualized. The right AICA is dominant. Both posterior cerebral arteries originate from the basilar tip. The PCA branch vessels are intact.  Venous sinuses: The dural sinuses are patent.  Anatomic variants: None  Delayed phase: No pathologic enhancement is evident.  IMPRESSION: 1. Occlusion of the left middle cerebral artery at the bifurcation with opacification of only the most anterior MCA branch vessels. 2. Some pial collaterals are evident. 3. Evidence of early infarct involving the anterior left MCA distribution with cortical indistinctness in decreased cortical definition in the posterior left insular cortex. The basal ganglia are intact. 4.  ASPECTS score = 7/10 5. Marked diffuse white matter hypoattenuation is evident bilaterally, compatible with chronic microvascular ischemia. 6. Extensive atherosclerotic changes involving with the carotid bifurcations bilaterally and cavernous internal carotid arteries without significant stenoses. 7. Moderate tortuosity throughout the neck. 8. Prominent thyroid goiter, more evident on the right. 9. Scoliosis.  These results were called by telephone at the time of interpretation on 04/10/2014 at 12:10 pm to Dr. Leroy Kennedy , who verbally acknowledged these results.   Electronically Signed   By: Marin Roberts M.D.   On:  04/10/2014 12:50   Ct Head Wo Contrast  04/10/2014   CLINICAL DATA:  Postop endovascular revascularization of the left middle cerebral artery earlier today.  EXAM: CT HEAD WITHOUT CONTRAST  TECHNIQUE: Contiguous axial images were obtained from the base of the skull through the vertex without intravenous contrast.  COMPARISON:  CT angio head earlier same date.  FINDINGS: No evidence of acute hemorrhage or hematoma in the left cerebral hemisphere after revascularization of the left middle cerebral artery. No evidence of cortical infarction currently in the left middle cerebral artery distribution. Lacunar type strokes in the basal ganglia bilaterally. Severe changes of small vessel disease of the white matter diffusely, including the pons and brainstem. Moderate cortical and deep atrophy as noted earlier.  IMPRESSION: 1. No evidence of acute hemorrhage or hematoma in the left cerebral hemisphere after revascularization of the left middle cerebral artery. 2. No evidence of acute cortical infarction at this time.   Electronically Signed   By: Hulan Saas M.D.   On: 04/10/2014 17:49   Ct Angio Neck W/cm &/or Wo/cm  04/10/2014   CLINICAL DATA:  Right-sided facial weakness and droop. Right-sided weakness. Sudden onset today. The patient fell on to the foot of her call are.  EXAM: CT ANGIOGRAPHY HEAD AND NECK  TECHNIQUE: Multidetector CT imaging of the head and neck was performed using the standard protocol during bolus administration of intravenous contrast. Multiplanar CT image reconstructions and MIPs were obtained to evaluate the vascular anatomy. Carotid stenosis measurements (when applicable) are obtained utilizing NASCET criteria, using the distal internal carotid diameter as the denominator.  CONTRAST:  50mL OMNIPAQUE IOHEXOL 350 MG/ML SOLN  COMPARISON:  None.  FINDINGS: CT HEAD  Brain: The patient's head is somewhat oblique in the scanner. Indistinct gray-white differentiation is present in the anterior  left frontal lobe above the level the ventricles and a superiorly. There is some indistinctness in the posterior left insular cortex as well. The basal ganglia are intact bilaterally. Extensive periventricular and subcortical white matter hypoattenuation is evident bilaterally. Ventricular size is proportionate to the degree of atrophy. Atherosclerotic calcifications are present within the cavernous internal carotid arteries and at the dural margin of the vertebral arteries  Calvarium and skull base: Extensive metallic artifact is noted from a mandibular prosthesis. The calvarium is intact.  Paranasal sinuses: Clear  Orbits: Unremarkable.  CTA NECK  Aortic arch: There is a common origin of the left common carotid artery in the innominate artery. Dense atherosclerotic calcifications are present in the aortic arch the and origins the great vessels without a significant stenosis.  Right carotid system: The right common carotid artery is within normal limits. Dense atherosclerotic calcifications are present within the proximal right internal carotid artery without a significant stenosis of greater than 50% relative to the more distal vessel. There is moderate tortuosity of the cervical right ICA without a significant stenosis.  Left carotid system: The left common carotid artery is tortuous proximally with atherosclerotic calcifications but no significant stenoses. Dense atherosclerotic calcifications are evident at the proximal left internal carotid artery without a significant stenosis. There is marked tortuosity of the cervical left ICA.  Vertebral arteries:Both vertebral arteries originate from the subclavian arteries. There is moderate tortuosity without a significant stenosis. The right vertebral artery  is slightly dominant to the left.  Skeleton: Levoconvex curvature is present in the upper thoracic spine with compensatory rightward curvature in the cervical spine. Advanced facet changes are noted. Endplate  changes are most pronounced on the left at C3-4 with osseous foraminal narrowing. No focal lytic or blastic lesions are present.  Other neck: A thyroid goiter is more pronounced on the right with heterogeneous density and scattered calcifications. No dominant lesions are present. The goiter extends 6.5 mm cephalo caudad.  No other significant soft tissue lesions are present in the neck or upper mediastinum.  CTA HEAD  Anterior circulation: Atherosclerotic calcifications are present within the cavernous internal carotid arteries bilaterally without a significant stenosis. 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 The right M1 segment is normal. The right A1 segment is aplastic. Both ACA vessels fill from the left. The proximal left M1 segment is unremarkable. The left MCA is occluded at the bifurcation with filling of only the most anterior branch. There is marked attenuation of an inferior posterior branch. The other MCA branch vessels are occluded with some pial collaterals.  Posterior circulation: The right vertebral artery is the dominant vessel. From the left vertebral artery bifurcates at the PICA ascending a small branch to the vertebrobasilar junction. A focal calcification is present at the vertebrobasilar junction. The right PICA is not visualized. The right AICA is dominant. Both posterior cerebral arteries originate from the basilar tip. The PCA branch vessels are intact.  Venous sinuses: The dural sinuses are patent.  Anatomic variants: None  Delayed phase: No pathologic enhancement is evident.  IMPRESSION: 1. Occlusion of the left middle cerebral artery at the bifurcation with opacification of only the most anterior MCA branch vessels. 2. Some pial collaterals are evident. 3. Evidence of early infarct involving the anterior left MCA distribution with cortical indistinctness in decreased cortical definition in the posterior left insular cortex. The  basal ganglia are intact. 4.  ASPECTS score = 7/10 5. Marked diffuse white matter hypoattenuation is evident bilaterally, compatible with chronic microvascular ischemia. 6. Extensive atherosclerotic changes involving with the carotid bifurcations bilaterally and cavernous internal carotid arteries without significant stenoses. 7. Moderate tortuosity throughout the neck. 8. Prominent thyroid goiter, more evident on the right. 9. Scoliosis. These results were called by telephone at the time of interpretation on 04/10/2014 at 12:10 pm to Dr. Leroy Kennedy , who verbally acknowledged these results.   Electronically Signed   By: Marin Roberts M.D.   On: 04/10/2014 12:50   Portable Chest Xray  04/11/2014   CLINICAL DATA:  Acute respiratory failure, history atrial fibrillation  EXAM: PORTABLE CHEST - 1 VIEW  COMPARISON:  Portable exam 0624 hr compared to 04/10/2014  FINDINGS: Tip of endotracheal tube at carina directed into RIGHT mainstem bronchus orifice; recommend withdrawal 2-3 cm for placement above carina.  Nasogastric tube extends into stomach.  Tortuous aorta.  Enlargement of cardiac silhouette post TAVR.  Peribronchial thickening.  Significant rotation to the RIGHT.  No definite infiltrate, pleural effusion, or pneumothorax.  IMPRESSION: Tip of endotracheal tube at carina; recommend withdrawal 2-3 cm.  Critical Value/emergent results were called by telephone at the time of interpretation on 04/11/2014 at 0829 hr to patient's nurse Megan RN on , who verbally acknowledged these results.   Electronically Signed   By: Ulyses Southward M.D.   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  Enlargement of cardiac  silhouette post TAVR.  Rotation to the RIGHT.  Tortuous aorta with atherosclerotic calcification.  No definite acute infiltrate, pleural effusion or pneumothorax.  Slight chronic accentuation of interstitial markings not significantly changed from previous exam.  Bones demineralized.  IMPRESSION: Enlargement of cardiac silhouette post TAVR.  Mild chronic interstitial changes without acute infiltrate.   Electronically Signed   By: Ulyses Southward M.D.   On: 04/10/2014 13:25   Dg Abd Portable 1v  04/10/2014   CLINICAL DATA:  81 year old female status post orogastric tube placement.  EXAM: PORTABLE ABDOMEN - 1 VIEW  COMPARISON:  No priors.  FINDINGS: An enteric tube is present with tip in the mid body of the stomach. Visualized bowel gas pattern is nonobstructive. Lower portions of the thorax are remarkable for the presence of and Edwards Sapien valve in the aortic position. Vascular stent in the region of the right external iliac vessels. Foley balloon catheter in the urinary bladder, with small amount of iodinated contrast in the urinary bladder.  IMPRESSION: 1. Tip of orogastric tube is in the body of the stomach.   Electronically Signed   By: Trudie Reed M.D.   On: 04/10/2014 19:29    Labs:  CBC:  Recent Labs  04/10/14 1148 04/10/14 1157 04/11/14 0400  WBC 5.4  --  6.7  HGB 11.3* 12.6 9.9*  HCT 34.3* 37.0 30.4*  PLT 171  --  148*    COAGS:  Recent Labs  04/10/14 1148  INR 1.03  APTT 28    BMP:  Recent Labs  04/10/14 1148 04/10/14 1157 04/11/14 0400  NA 140 140 141  K 4.3 4.2 2.9*  CL 101 102 110  CO2 27  --  24  GLUCOSE 103* 100* 154*  BUN 22 25* 11  CALCIUM 9.8  --  8.2*  CREATININE 0.93 1.00 0.62  GFRNONAA 54*  --  80*  GFRAA 63*  --  >90    LIVER FUNCTION TESTS:  Recent Labs  04/10/14 1148 04/11/14 0400  BILITOT 0.7 0.6  AST 26 32  ALT 16 24  ALKPHOS 71 62  PROT 7.1 5.6*  ALBUMIN 3.9 2.9*    Assessment and Plan: S/p left MCA CVA with cerebral angio  and revasc/IA TPA/clot retrieval 3/12; 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

## 2014-04-11 NOTE — Progress Notes (Signed)
PT Cancellation Note  Patient Details Name: [name] J Halbur MRN: [id number] DOB: Dec 20, 1926   Cancelled Treatment:    Reason Eval/Treat Not Completed: Medical issues which prohibited therapy.  Pt currently on bedrest per order.  Note that pt to have sheath pulled as well this am.  Pt appears more appropriate to be checked on tomorrow.  MD:  Please remove bedrest order when appropriate and indicate what activity is desired in orders.  Thanks.   Tawni MillersWhite, Alexzavier Girardin F 04/11/2014, 8:41 AM  Eber Jonesawn Catia Todorov,PT Acute Rehabilitation [phone])

## 2014-04-12 ENCOUNTER — Inpatient Hospital Stay (HOSPITAL_COMMUNITY): Payer: Medicare Other

## 2014-04-12 DIAGNOSIS — I482 Chronic atrial fibrillation, unspecified: Secondary | ICD-10-CM | POA: Insufficient documentation

## 2014-04-12 DIAGNOSIS — I63412 Cerebral infarction due to embolism of left middle cerebral artery: Principal | ICD-10-CM

## 2014-04-12 DIAGNOSIS — E785 Hyperlipidemia, unspecified: Secondary | ICD-10-CM | POA: Insufficient documentation

## 2014-04-12 DIAGNOSIS — R918 Other nonspecific abnormal finding of lung field: Secondary | ICD-10-CM

## 2014-04-12 DIAGNOSIS — I6350 Cerebral infarction due to unspecified occlusion or stenosis of unspecified cerebral artery: Secondary | ICD-10-CM

## 2014-04-12 DIAGNOSIS — R1314 Dysphagia, pharyngoesophageal phase: Secondary | ICD-10-CM

## 2014-04-12 DIAGNOSIS — I5023 Acute on chronic systolic (congestive) heart failure: Secondary | ICD-10-CM

## 2014-04-12 DIAGNOSIS — I6300 Cerebral infarction due to thrombosis of unspecified precerebral artery: Secondary | ICD-10-CM

## 2014-04-12 DIAGNOSIS — I10 Essential (primary) hypertension: Secondary | ICD-10-CM

## 2014-04-12 DIAGNOSIS — E43 Unspecified severe protein-calorie malnutrition: Secondary | ICD-10-CM | POA: Insufficient documentation

## 2014-04-12 DIAGNOSIS — I639 Cerebral infarction, unspecified: Secondary | ICD-10-CM | POA: Insufficient documentation

## 2014-04-12 LAB — GLUCOSE, CAPILLARY
GLUCOSE-CAPILLARY: 118 mg/dL — AB (ref 70–99)
GLUCOSE-CAPILLARY: 117 mg/dL — AB (ref 70–99)
Glucose-Capillary: 110 mg/dL — ABNORMAL HIGH (ref 70–99)
Glucose-Capillary: 121 mg/dL — ABNORMAL HIGH (ref 70–99)
Glucose-Capillary: 151 mg/dL — ABNORMAL HIGH (ref 70–99)

## 2014-04-12 LAB — BASIC METABOLIC PANEL
Anion gap: 4 — ABNORMAL LOW (ref 5–15)
BUN: 13 mg/dL (ref 6–23)
CALCIUM: 8.8 mg/dL (ref 8.4–10.5)
CO2: 25 mmol/L (ref 19–32)
Chloride: 115 mmol/L — ABNORMAL HIGH (ref 96–112)
Creatinine, Ser: 0.73 mg/dL (ref 0.50–1.10)
GFR calc Af Amer: 88 mL/min — ABNORMAL LOW (ref >90)
GFR, EST NON AFRICAN AMERICAN: 76 mL/min — AB (ref >90)
Glucose, Bld: 120 mg/dL — ABNORMAL HIGH (ref 70–99)
Potassium: 3 mmol/L — ABNORMAL LOW (ref 3.5–5.1)
Sodium: 144 mmol/L (ref 135–145)

## 2014-04-12 LAB — BLOOD GAS, ARTERIAL
Acid-base deficit: 1 mmol/L (ref 0.0–2.0)
Bicarbonate: 23.9 mEq/L (ref 20.0–24.0)
Drawn by: 22766
FIO2: 1 %
MECHVT: 400 mL
O2 SAT: 99.9 %
PATIENT TEMPERATURE: 98.6
PEEP: 5 cmH2O
RATE: 14 resp/min
TCO2: 25.3 mmol/L (ref 0–100)
pCO2 arterial: 45.3 mmHg — ABNORMAL HIGH (ref 35.0–45.0)
pH, Arterial: 7.343 — ABNORMAL LOW (ref 7.350–7.450)
pO2, Arterial: 429 mmHg — ABNORMAL HIGH (ref 80.0–100.0)

## 2014-04-12 LAB — HEMOGLOBIN A1C
HEMOGLOBIN A1C: 5.5 % (ref 4.8–5.6)
MEAN PLASMA GLUCOSE: 111 mg/dL

## 2014-04-12 MED ORDER — POTASSIUM CHLORIDE 10 MEQ/100ML IV SOLN
10.0000 meq | INTRAVENOUS | Status: AC
  Administered 2014-04-12 (×2): 10 meq via INTRAVENOUS
  Filled 2014-04-12 (×2): qty 100

## 2014-04-12 MED ORDER — FUROSEMIDE 10 MG/ML IJ SOLN
40.0000 mg | Freq: Once | INTRAMUSCULAR | Status: AC
  Administered 2014-04-12: 40 mg via INTRAVENOUS
  Filled 2014-04-12: qty 4

## 2014-04-12 MED ORDER — HEPARIN SODIUM (PORCINE) 5000 UNIT/ML IJ SOLN
5000.0000 [IU] | Freq: Three times a day (TID) | INTRAMUSCULAR | Status: DC
  Administered 2014-04-12 – 2014-04-15 (×9): 5000 [IU] via SUBCUTANEOUS
  Filled 2014-04-12 (×9): qty 1

## 2014-04-12 MED ORDER — ASPIRIN 300 MG RE SUPP: 300.0000 mg | Freq: Every day | RECTAL | Status: DC

## 2014-04-12 MED ORDER — LISINOPRIL 2.5 MG PO TABS
2.5000 mg | ORAL_TABLET | Freq: Every day | ORAL | Status: DC
  Administered 2014-04-12 – 2014-04-13 (×2): 2.5 mg via ORAL
  Filled 2014-04-12 (×2): qty 1

## 2014-04-12 MED ORDER — OSMOLITE 1.2 CAL PO LIQD
1000.0000 mL | ORAL | Status: DC
  Administered 2014-04-12: 1000 mL
  Filled 2014-04-12 (×5): qty 1000

## 2014-04-12 MED ORDER — DIGOXIN 125 MCG PO TABS
125.0000 ug | ORAL_TABLET | Freq: Every day | ORAL | Status: DC
  Administered 2014-04-12 – 2014-04-13 (×2): 125 ug via ORAL
  Filled 2014-04-12 (×2): qty 1

## 2014-04-12 MED ORDER — VITAL HIGH PROTEIN PO LIQD
1000.0000 mL | ORAL | Status: DC
  Administered 2014-04-12: 1000 mL
  Filled 2014-04-12 (×2): qty 1000

## 2014-04-12 MED ORDER — FUROSEMIDE 20 MG PO TABS
20.0000 mg | ORAL_TABLET | Freq: Two times a day (BID) | ORAL | Status: DC
  Administered 2014-04-12: 20 mg via ORAL
  Filled 2014-04-12 (×4): qty 1

## 2014-04-12 MED ORDER — HEPARIN SODIUM (PORCINE) 1000 UNIT/ML IJ SOLN
5000.0000 [IU] | Freq: Three times a day (TID) | INTRAMUSCULAR | Status: DC
  Filled 2014-04-12 (×3): qty 5

## 2014-04-12 MED ORDER — ASPIRIN 325 MG PO TABS
325.0000 mg | ORAL_TABLET | Freq: Every day | ORAL | Status: DC
  Administered 2014-04-12 – 2014-04-13 (×2): 325 mg via NASOGASTRIC
  Filled 2014-04-12 (×2): qty 1

## 2014-04-12 MED ORDER — POTASSIUM CHLORIDE 20 MEQ/15ML (10%) PO SOLN: 30.0000 meq | ORAL | Status: DC

## 2014-04-12 MED ORDER — METOPROLOL TARTRATE 50 MG PO TABS
50.0000 mg | ORAL_TABLET | Freq: Two times a day (BID) | ORAL | Status: DC
  Administered 2014-04-12 – 2014-04-13 (×3): 50 mg via ORAL
  Filled 2014-04-12 (×4): qty 1

## 2014-04-12 NOTE — Evaluation (Signed)
Physical Therapy Evaluation Patient Details Name: [name] MRN: [id number] DOB: [birth year] Today's Date: 04/12/2014   History of Present Illness  Pt admited with right-sided facial droop, right-sided hemiparesis, aphasia. PMHx: S/p aortic valve replacement, HTN, A-fib  Clinical Impression  Pt without verbalization during session, but does seem to follow some simple directions.  Pt family concerned about pt recovery and long term needs.  Advised family to speak to SW about SNF and other resources if they choose to take pt home.  Will continue to follow.      Follow Up Recommendations SNF    Equipment Recommendations  None recommended by PT    Recommendations for Other Services       Precautions / Restrictions Precautions Precautions: Fall Restrictions Weight Bearing Restrictions: No Other Position/Activity Restrictions: Watch for alignment with WB'ing due to sublux      Mobility  Bed Mobility Overal bed mobility: Needs Assistance;+2 for physical assistance Bed Mobility: Supine to Sit     Supine to sit: Max assist;+2 for physical assistance     General bed mobility comments: A with Bil LEs, trunk, and scooting hips to EOB.    Transfers Overall transfer level: Needs assistance Equipment used: 2 person hand held assist Transfers: Sit to/from [employer] Sit to Stand: Mod assist;+2 physical assistance Stand pivot transfers: +2 physical assistance;Mod assist          Ambulation/Gait                Stairs            Wheelchair Mobility    Modified Rankin (Stroke Patients Only)       Balance Overall balance assessment: Needs assistance Sitting-balance support: Single extremity supported;Feet supported Sitting balance-Leahy Scale: Poor Sitting balance - Comments: Initially poor with lean to right; but progressed to fair   Standing balance support: During functional activity Standing balance-Leahy Scale: Zero                                Pertinent Vitals/Pain Pain Assessment: Faces Faces Pain Scale: No hurt    Home Living Family/patient expects to be discharged to:: Skilled nursing facility                      Prior Function Level of Independence: Independent         Comments: Including driving     Hand Dominance   Dominant Hand: Right    Extremity/Trunk Assessment   Upper Extremity Assessment: Defer to OT evaluation           Lower Extremity Assessment: RLE deficits/detail RLE Deficits / Details: Trace active movements noted.  Pain sensation intact as pt grimaces with painful stimuli, but is unable to withdraw LE.      Cervical / Trunk Assessment: Kyphotic  Communication   Communication: Receptive difficulties;Expressive difficulties  Cognition Arousal/Alertness: Awake/alert Behavior During Therapy: Flat affect Overall Cognitive Status: Difficult to assess                      General Comments      Exercises        Assessment/Plan    PT Assessment Patient needs continued PT services  PT Diagnosis Difficulty walking   PT Problem List Decreased strength;Decreased activity tolerance;Decreased balance;Decreased mobility;Decreased coordination;Decreased cognition;Decreased knowledge of use of DME;Decreased safety awareness;Impaired sensation  PT Treatment Interventions DME instruction;Gait training;Functional mobility training;Therapeutic  activities;Therapeutic exercise;Balance training;Neuromuscular re-education;Cognitive remediation;Patient/family education   PT Goals (Current goals can be found in the Care Plan section) Acute Rehab PT Goals Patient Stated Goal: unable  PT Goal Formulation: With family Time For Goal Achievement: 04/26/14 Potential to Achieve Goals: Good    Frequency Min 3X/week   Barriers to discharge        Co-evaluation PT/OT/SLP Co-Evaluation/Treatment: Yes Reason for Co-Treatment: Complexity of the patient's  impairments (multi-system involvement) PT goals addressed during session: Mobility/safety with mobility;Balance         End of Session Equipment Utilized During Treatment: Gait belt Activity Tolerance: Patient tolerated treatment well Patient left: in chair;with call bell/phone within reach;with family/visitor present Nurse Communication: Mobility status         Time: [id number] PT Time Calculation (min) (ACUTE ONLY): 27 min   Charges:   PT Evaluation $Initial PT Evaluation Tier I: 1 Procedure     PT G CodesSunny Schlein:        Sumiye Hirth F, [place]PT [phone] 04/12/2014, 4:09 PM
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
## 2014-04-12 NOTE — Progress Notes (Signed)
  Echocardiogram 2D Echocardiogram has been performed.  Janalyn HarderWest, Keylin Podolsky R 04/12/2014, 10:11 AM

## 2014-04-12 NOTE — Progress Notes (Signed)
[facility] FOR AM LAB REPLACEMENT ONLY  The patient does apply [facility] the [facility] Protocol based on the criteria listed below:   1. Is GFR >/= 40 ml/min? Yes.    Patient's GFR today is 76 2. Is urine output >/= 0.5 ml/kg/hr for the last 6 hours? Yes.   Patient's UOP is 1 ml/kg/hr 3. Is BUN < 60 mg/dL? Yes.    Patient's BUN today is 13 4. Abnormal electrolyte(s): k 3.0 5. Ordered repletion with: per protocol 6. If a panic level lab has been reported, has the CCM MD in charge been notified? No..   Physician:    Markus DaftWHELAN, Janielle Mittelstadt A 04/12/2014 5:14 AM

## 2014-04-12 NOTE — Progress Notes (Signed)
INITIAL NUTRITION ASSESSMENT  DOCUMENTATION CODES Per approved criteria  -Severe malnutrition in the context of chronic illness -Underweight  Pt meets criteria for severe malnutrition in the context of chronic illness as evidenced by severe muscle mass and subcutaneous body fat depletion.  INTERVENTION: Initiate Osmolite 1.2 via NGT at 20 ml/hr and increase by 10 ml every 12 hours to goal rate of 45 ml/hr  Tube feeding regimen provides 1296 kcal (100% of estimated energy needs), 60 grams of protein and 886 ml of free water  -Recommend monitor K, Mg, and Phos closely x 3 days, due to pt's high risk of refeeding syndrome  NUTRITION DIAGNOSIS: Inadequate oral intake related to failed swallow evaluation as evidenced by NPO status.   Goal: Pt to meet >/= 90% of estimated energy needs  Monitor:  TF adequacy/tolerance, Ph, Mg, weight trends, I/O's  Reason for Assessment: Consult for enteral/TF initiation and management  81 y.o. female  Admitting Dx: <principal problem not specified>  ASSESSMENT: 81 y/o female admitted on 3/12 s/p left MCA territory CVA. Pt was given systemic TPA in ER then went to IR for endovascular intervention and returned to ICU on vent. PCCM was asked to help with post op vent management.  Labs- low K; high Cl Pt was extubated 3/13. Pt failed speech evaluation today. Nasogastric tube in place.  Pt confirmed recent weight loss; however, she was not able to provide anymore diet history due to being nonverbal. NFPE reveals severe subcutaneous body fat and muscle mass depletion in the upper body. Pt is at risk for refeeding syndrome based on unavailable diet history; monitor labs. Will advance TF slowly, and monitor tolerance.  Follow-up with diet history once patient is able to communicate.   Nutrition Focused Physical Exam:  Subcutaneous Fat:  Orbital Region: severe depletion Upper Arm Region: moderate depletion Thoracic and Lumbar Region: n/a  Muscle:   Temple Region: severe depletion Clavicle Bone Region: severe depletion Clavicle and Acromion Bone Region: severe depletion Scapular Bone Region: n/a Dorsal Hand: moderate depletion Patellar Region: WDL Anterior Thigh Region: WDL Posterior Calf Region: WDL  Edema: not present   Height: Ht Readings from Last 1 Encounters:  04/10/14 5\' 2"  (1.575 m)    Weight: Wt Readings from Last 1 Encounters:  04/10/14 100 lb (45.36 kg)    Ideal Body Weight: 110 lb (50 kg)  % Ideal Body Weight: 90.7%  Wt Readings from Last 10 Encounters:  04/10/14 100 lb (45.36 kg)  04/11/14 100 lb (45.36 kg)    Usual Body Weight: unknown  % Usual Body Weight: -  BMI:  Body mass index is 18.29 kg/(m^2). underweight   Estimated Nutritional Needs: Kcal: 1200-1360 Protein: 55-70 grams Fluid: >/= 1.2L daily  Skin: intact  Diet Order: Diet NPO time specified  EDUCATION NEEDS: -No education needs identified at this time   Intake/Output Summary (Last 24 hours) at 04/12/14 1406 Last data filed at 04/12/14 0900  Gross per 24 hour  Intake 281.23 ml  Output   1000 ml  Net -718.77 ml    Last BM: pta  Labs:   Recent Labs Lab 04/11/14 0400 04/11/14 2001 04/12/14 0400  NA 141 141 144  K 2.9* 3.6 3.0*  CL 110 112 115*  CO2 24 22 25   BUN 11 12 13   CREATININE 0.62 0.66 0.73  CALCIUM 8.2* 8.9 8.8  GLUCOSE 154* 135* 120*    CBG (last 3)   Recent Labs  04/12/14 0322 04/12/14 0808 04/12/14 1137  GLUCAP 110* 118*  117*    Scheduled Meds: .  stroke: mapping our early stages of recovery book   Does not apply Once  . antiseptic oral rinse  7 mL Mouth Rinse QID  . aspirin  300 mg Rectal Daily  . aspirin  325 mg Per NG tube Daily  . chlorhexidine  15 mL Mouth Rinse BID  . feeding supplement (VITAL HIGH PROTEIN)  1,000 mL Per Tube Q24H  . heparin  5,000 Units Intravenous TID  . methimazole  5 mg Per Tube Daily  . pantoprazole (PROTONIX) IV  40 mg Intravenous QHS  . pravastatin  40  mg Per Tube q1800  . sodium chloride  500 mL Intravenous Once    Continuous Infusions: . sodium chloride 40 mL/hr (04/12/14 0925)  . dexmedetomidine Stopped (04/11/14 1656)  . phenylephrine (NEO-SYNEPHRINE) Adult infusion 5 mcg/min (04/11/14 1656)    Past Medical History  Diagnosis Date  . Atrial fibrillation     Past Surgical History  Procedure Laterality Date  . Radiology with anesthesia N/A 04/10/2014    Procedure: RADIOLOGY WITH ANESTHESIA;  Surgeon: Julieanne Cotton, MD;  Location: [facility] OR;  Service: Radiology;  Laterality: N/A;    Cristela Felt, MS Dietetic Intern Pager: [phone]

## 2014-04-12 NOTE — Progress Notes (Addendum)
PULMONARY / CRITICAL CARE MEDICINE   Name: RAMA SORCI MRN: [id number] DOB: Dec 21, 1926    ADMISSION DATE:  04/10/2014 CONSULTATION DATE:  3/12  REFERRING MD :  Leroy Kennedy   CHIEF COMPLAINT:  Vent management s/p CVA   INITIAL PRESENTATION:  81 year old female admitted on 3/12 s/p left MCA territory CVA. Was given systemic TPA in ER then went to IR for endovascular intervention which included: intra-arterial TPA and clot retrieval. Returned to ICU on vent.  PCCM was asked to help w/ post op vent management   STUDIES:  CT angio 3/12:  Occlusion of the left middle cerebral artery at the bifurcation with opacification of only the most anterior MCA branch vessels. Some pial collaterals are evident. Evidence of early infarct involving the anterior left MCA distribution with cortical indistinctness in decreased cortical definition in the posterior left insular cortex. The basal ganglia are intact. Marked diffuse white matter hypoattenuation is evident bilaterally, compatible with chronic microvascular ischemia.Extensive atherosclerotic changes involving with the carotid bifurcations bilaterally and cavernous internal carotid arteries without significant stenoses. Moderate tortuosity throughout the neck.Prominent thyroid goiter, more evident on the right. Scoliosis.  SIGNIFICANT EVENTS: 3/12 TPA in ER then went to IR for Endovascular  3/12: S/P 4 vessel cerebral arteriogram followed by near complete revascularization of occluded Lt MCA mid M1 segment with X2 passes of 4mm x 30 mm Trevoprovue retrieval device and  of Superselective intracranial TPA with establishment of TICI 2B flow In the LT MCA distribution   SUBJECTIVE:  No distress  VITAL SIGNS: Temp:  [98.3 F (36.8 C)-99.7 F (37.6 C)] 99.7 F (37.6 C) (03/14 0810) Pulse Rate:  [28-142] 77 (03/14 0900) Resp:  [12-28] 28 (03/14 0900) BP: (109-156)/(43-89) 146/89 mmHg (03/14 0900) SpO2:  [94 %-100 %] 94 % (03/14 0900) Arterial  Line BP: (122-160)/(50-67) 141/62 mmHg (03/14 0900) FiO2 (%):  [40 %] 40 % (03/13 1630) Weight:  [45.36 kg (100 lb)] 45.36 kg (100 lb) (03/13 1521) HEMODYNAMICS:   VENTILATOR SETTINGS: Vent Mode:  [-] PSV;CPAP FiO2 (%):  [40 %] 40 % PEEP:  [5 cmH20] 5 cmH20 Pressure Support:  [5 cmH20-10 cmH20] 5 cmH20 INTAKE / OUTPUT:  Intake/Output Summary (Last 24 hours) at 04/12/14 1036 Last data filed at 04/12/14 0900  Gross per 24 hour  Intake 454.11 ml  Output   1325 ml  Net -870.89 ml    PHYSICAL EXAMINATION: General:  No distress in chair Neuro: R hemiplegia  HEENT:  NCAT, R facial droop Cardiovascular: s1 s2 IRT mild Lungs:  clear Abdomen:  Soft BS wnl ,no r/g Ext: no edema   LABS: I have reviewed all of today's lab results. Relevant abnormalities are discussed in the A/P section  CXR: NAD  ASSESSMENT / PLAN:  PULMONARY OETT 3/12>>>3/13 A: acute respiratory failure s/p CVA  likley some edema Restrictive lung dz P:   Monitor in ICU post extubation Supp O2 as needed lasix  CARDIOVASCULAR A:  AF, controlled rate  HTN  P:  Cont current Rx Tele Stroke work up, likely was embolic Echo assessment  RENAL A:   K supp P:   K runs bme tin am  Avoid free water Lasix, some int distres  GASTROINTESTINAL A:   Dysphagia Chronic PPI use P:   SUP IV PPI SLP eval important If fail then to NGT feeds  HEMATOLOGIC A:   No issues P:  DVT px: SQ heparin Monitor CBC intermittently Transfuse per usual ICU guidelines  INFECTIOUS A:  No evidence  of infection  P:   Monitor temp, WBC count Micro and abx as above  ENDOCRINE A:   No h/o DM  P:   Trend glucose q4  NEUROLOGIC A:   Acute MCA CVA P:   Mgmt per Stroke team    IS if able, consider move out of ICU, control BP, K supp, avoid free water, bme tin am, slp DNR noted  Mcarthur Rossettianiel J. Tyson AliasFeinstein, MD, FACP Pgr: [phone] [place] [facility] 04/12/2014 10:37 AM

## 2014-04-12 NOTE — Evaluation (Signed)
Speech Language Pathology Evaluation Patient Details Name: [name] MRN: [id number] DOB: 04-Jun-1926 Today's Date: 04/12/2014 Time: [id number] SLP Time Calculation (min) (ACUTE ONLY): 17 min  Problem List:  Patient Active Problem List   Diagnosis Date Noted  . Acute ischemic stroke   . Stroke   . Stroke with cerebral ischemia 04/10/2014  . Acute respiratory failure   . Essential hypertension    Past Medical History:  Past Medical History  Diagnosis Date  . Atrial fibrillation    Past Surgical History:  Past Surgical History  Procedure Laterality Date  . Radiology with anesthesia N/A 04/10/2014    Procedure: RADIOLOGY WITH ANESTHESIA;  Surgeon: Julieanne CottonSanjeev Deveshwar, MD;  [facility];  Service: Radiology;  Laterality: N/A;   HPI:  81 year old female, PMH A-fib admitted falling against her vehicle while pumping gas with right-sided facial droop, right-sided hemiparesis.Intubated 3/12-3/13. MRI acute nonhemorrhagic infarct involving the left frontal operculum, posterior insular cortex, precentral gyrus, and scattered areas in the left parietal lobe, remote lacunar infarcts of the basal ganglia bilaterally. CXR 3/14 hazy increased RIGHT mid lung opacity question atelectasis versus infiltrate.   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 Followed 2 of 8 one step commands with visual imitation cues. Nodded head appropriately to yes/no question x 1, however unable to establish functional yes/no response system. Moved lips once during automatic task (singing). Family states pt has macular degeneration and tunnel vision; question visual disturbance with CVA. Educated son and daughter on stragies to facilitate communication and requested family photos and written information re: pt. Pt would benefit from intensive language treatment to establish functional  communication and cogntition.      SLP Assessment  Patient needs continued Speech Lanaguage Pathology Services    Follow Up Recommendations  Skilled Nursing facility    Frequency and Duration min 2x/week  2 weeks   Pertinent Vitals/Pain Pain Assessment: No/denies pain   SLP Goals  Potential to Achieve Goals (ACUTE ONLY): Fair Potential Considerations (ACUTE ONLY): Severity of impairments  SLP Evaluation Prior Functioning  Cognitive/Linguistic Baseline: Within functional limits  Lives With: Alone Available Help at Discharge:  (family states no 24 hr supervision available per OT)   Cognition  Overall Cognitive Status: Impaired/Different from baseline Arousal/Alertness: Awake/alert Orientation Level:  (did not respond to yes/no ?'s re: orientation) Attention: Sustained Sustained Attention: Impaired Sustained Attention Impairment: Verbal basic Memory:  (TBA) Awareness: Impaired Awareness Impairment: Anticipatory impairment;Intellectual impairment;Emergent impairment Problem Solving: Impaired Problem Solving Impairment: Functional basic Safety/Judgment: Impaired    Comprehension  Auditory Comprehension Overall Auditory Comprehension: Impaired Yes/No Questions: Impaired Basic Biographical Questions: 0-25% accurate ( 1 out of 10) Commands: Impaired One Step Basic Commands: 0-24% accurate (0% independent, imitated oral motor x 2) Interfering Components: Attention;Processing speed Visual Recognition/Discrimination Discrimination: Not tested Reading Comprehension Reading Status:  (TBA)    Expression Expression Primary Mode of Expression:  (nonverbal) Verbal Expression Overall Verbal Expression: Impaired Initiation: Impaired Automatic Speech:  (minimal to no response) Repetition: Impaired Level of Impairment: Word level Naming: Impairment Responsive: 0-25% accurate (0%) Confrontation: Impaired Convergent: 0-24% accurate Divergent: Not tested (0%) Pragmatics: No  impairment Written Expression Dominant Hand: Right Written Expression: Not tested   Oral / Motor Oral Motor/Sensory Function Overall Oral Motor/Sensory Function: Impaired Labial ROM: Reduced right Labial Symmetry: Abnormal symmetry right Labial Strength: Reduced Lingual ROM: Reduced right Lingual Symmetry: Within Functional Limits Lingual Strength: Reduced Facial ROM: Reduced right  Facial Symmetry: Right droop Facial Strength: Reduced Mandible: Within Functional Limits Motor Speech Overall Motor Speech: Impaired Respiration: Impaired Level of Impairment: Word Phonation:  (no vocalizations) Resonance:  (no vocalizations) Articulation:  (no vocalizations) Intelligibility: Unable to assess (comment) Motor Planning:  (suspect impairment)   GO     Royce Macadamia 04/12/2014, 12:13 PM   Breck Coons Lauryl Seyer M.Ed [employer] [phone]

## 2014-04-12 NOTE — Evaluation (Signed)
Occupational Therapy Evaluation Patient Details Name: KAELENE ELLISTON MRN: [id number] DOB: January 26, 1927 Today's Date: 04/12/2014    History of Present Illness Pt admited with right-sided facial droop, right-sided hemiparesis, aphasia. PMHx: S/p aortic valve replacement, HTN, A-fib   Clinical Impression   This 81 yo female admitted with above presents to acute OT with decreased mobility, decreased balance, decreased cognition, expressive/receptive speech difficulties, decreased use of right side, and visual deficits all affecting her ability to care for herself at an independent level at home as she was pta. She will benefit from acute OT with follow up at SNF to get to back to as independent as possible.    Follow Up Recommendations  SNF    Equipment Recommendations   (TBD at next venue)       Precautions / Restrictions Precautions Precautions: Fall Restrictions Weight Bearing Restrictions: Yes Other Position/Activity Restrictions: Watch for alignment with WB'ing due to sublux      Mobility Bed Mobility Overal bed mobility: Needs Assistance;+2 for physical assistance Bed Mobility: Supine to Sit     Supine to sit: Max assist;+2 for physical assistance        Transfers Overall transfer level: Needs assistance Equipment used: 2 person hand held assist Transfers: Sit to/from [employer] Sit to Stand: Mod assist;+2 physical assistance Stand pivot transfers: +2 physical assistance;Mod assist            Balance Overall balance assessment: Needs assistance Sitting-balance support: Single extremity supported;Feet supported   Sitting balance - Comments: Initially poor with lean to right; but progressed to fair   Standing balance support: No upper extremity supported Standing balance-Leahy Scale: Zero                              ADL Overall ADL's : Needs assistance/impaired Eating/Feeding: NPO   Grooming: Maximal assistance;Sitting    Upper Body Bathing: Maximal assistance;Sitting   Lower Body Bathing: Total assistance (with Mod A +2 sit<>stand)   Upper Body Dressing : Total assistance;Sitting   Lower Body Dressing: Total assistance (with Mod A +2 sit<>stand)   Toilet Transfer: Maximal assistance;+2 for physical assistance;Stand-pivot   Toileting- Clothing Manipulation and Hygiene: Total assistance (with Mod A +2 sit<>stand)               Vision Vision Assessment?: Vision impaired- to be further tested in functional context Additional Comments: wears glasses for reading only (son thinks); pt unable to report if visual changes due to expressive difficulties; does tend to have head turned to her left; however when you speak to her on her right side she will turn and look at you.          Pertinent Vitals/Pain Pain Assessment:  (chronic pain in back per pt's son)     Hand Dominance Right   Extremity/Trunk Assessment Upper Extremity Assessment Upper Extremity Assessment: RUE deficits/detail RUE Deficits / Details: Brunstrum 0; PROM WNL, shoulder with sublux RUE Coordination: decreased fine motor;decreased gross motor           Communication Communication Communication: Receptive difficulties;Expressive difficulties   Cognition Arousal/Alertness: Awake/alert Behavior During Therapy: Flat affect Overall Cognitive Status: Impaired/Different from baseline Area of Impairment: Following commands       Following Commands: Follows one step commands inconsistently;Follows one step commands with increased time       General Comments: Rest of cognition difficult to test due to expressive difficulties  Home Living Family/patient expects to be discharged to:: Skilled nursing facility Living Arrangements: Alone                                      Prior Functioning/Environment Level of Independence: Independent        Comments: Including driving    OT Diagnosis:  Generalized weakness;Cognitive deficits;Disturbance of vision;Hemiplegia dominant side   OT Problem List: Decreased strength;Decreased range of motion;Impaired balance (sitting and/or standing);Impaired vision/perception;Decreased cognition;Decreased safety awareness;Impaired UE functional use;Decreased knowledge of precautions;Decreased knowledge of use of DME or AE;Impaired tone;Decreased coordination   OT Treatment/Interventions: Self-care/ADL training;Patient/family education;Visual/perceptual remediation/compensation;Balance training;Neuromuscular education;Therapeutic exercise;DME and/or AE instruction;Cognitive remediation/compensation;Therapeutic activities    OT Goals(Current goals can be found in the care plan section) Acute Rehab OT Goals Patient Stated Goal: unable  OT Goal Formulation: With family Time For Goal Achievement: 04/12/14 Potential to Achieve Goals: Good  OT Frequency: Min 2X/week           Co-evaluation PT/OT/SLP Co-Evaluation/Treatment: Yes Reason for Co-Treatment: For patient/therapist safety   OT goals addressed during session: ADL's and self-care;Strengthening/ROM      End of Session Equipment Utilized During Treatment: Gait belt Nurse Communication: Mobility status  Activity Tolerance: Patient tolerated treatment well Patient left: in chair;with call bell/phone within reach;with family/visitor present   Time: [id number] OT Time Calculation (min): 32 min Charges:  OT General Charges $OT Visit: 1 Procedure OT Treatments $Self Care/Home Management : 8-22 mins  Evette GeorgesLeonard, Kadan Millstein Eva [phone] 04/12/2014, 11:11 AM

## 2014-04-12 NOTE — Progress Notes (Signed)
Referring Physician(s): CCM  Subjective:  CVA L MCA revascularization- IA tpa/clot retrieval 3/12 Up in chair Moving Left extremities some--not to command Non verbal  Allergies: Keflex; Penicillins; Prednisone; and Sulfa antibiotics  Medications: Prior to Admission medications   Medication Sig Start Date End Date Taking? Authorizing Provider  aspirin 325 MG tablet Take 325 mg by mouth daily.   Yes Historical Provider, MD  beta carotene w/minerals (OCUVITE) tablet Take 1 tablet by mouth daily.   Yes Historical Provider, MD  DIGOX 125 MCG tablet Take 125 mcg by mouth daily. 03/15/14  Yes Historical Provider, MD  furosemide (LASIX) 40 MG tablet Take 60 mg by mouth 2 (two) times daily. 03/29/14  Yes Historical Provider, MD  lisinopril (PRINIVIL,ZESTRIL) 2.5 MG tablet Take 2.5 mg by mouth daily. 03/15/14  Yes Historical Provider, MD  methimazole (TAPAZOLE) 5 MG tablet Take 5 mg by mouth daily. 03/22/14  Yes Historical Provider, MD  metoprolol (LOPRESSOR) 100 MG tablet Take 50 mg by mouth 2 (two) times daily. 03/22/14  Yes Historical Provider, MD  omeprazole (PRILOSEC) 40 MG capsule Take 40 mg by mouth daily. 03/01/14  Yes Historical Provider, MD  potassium chloride SA (K-DUR,KLOR-CON) 20 MEQ tablet Take 30 mEq by mouth 2 (two) times daily. 04/05/14  Yes Historical Provider, MD  pravastatin (PRAVACHOL) 20 MG tablet Take 20 mg by mouth every evening. 04/05/14  Yes Historical Provider, MD  traMADol-acetaminophen (ULTRACET) 37.5-325 MG per tablet Take 1 tablet by mouth every 6 (six) hours as needed (pain).   Yes Historical Provider, MD  VENTOLIN HFA 108 (90 BASE) MCG/ACT inhaler Inhale 2 puffs into the lungs every 6 (six) hours as needed. 01/08/14  Yes Historical Provider, MD     Vital Signs: BP 146/89 mmHg  Pulse 77  Temp(Src) 99.7 F (37.6 C) (Axillary)  Resp 28  Ht  (1.575 m)  Wt 45.36 kg (100 lb)  BMI 18.29 kg/m2  SpO2 94%  LMP  (LMP Unknown)  Physical Exam  Neurological:    Non verbal Up in chair Can move Left exts - not to command Not able to focus on me  Rt groin NT; no bleeding; no hematoma 2+ pulses Rt foot    Imaging: Ct Angio Head W/cm &/or Wo Cm  04/10/2014   CLINICAL DATA:  Right-sided facial weakness and droop. Right-sided weakness. Sudden onset today. The patient fell on to the foot of her call are.  EXAM: CT ANGIOGRAPHY HEAD AND NECK  TECHNIQUE: Multidetector CT imaging of the head and neck was performed using the standard protocol during bolus administration of intravenous contrast. Multiplanar CT image reconstructions and MIPs were obtained to evaluate the vascular anatomy. Carotid stenosis measurements (when applicable) are obtained utilizing NASCET criteria, using the distal internal carotid diameter as the denominator.  CONTRAST:  50mL OMNIPAQUE IOHEXOL 350 MG/ML SOLN  COMPARISON:  None.  FINDINGS: CT HEAD  Brain: The patient's head is somewhat oblique in the scanner. Indistinct gray-white differentiation is present in the anterior left frontal lobe above the level the ventricles and a superiorly. There is some indistinctness in the posterior left insular cortex as well. The basal ganglia are intact bilaterally. Extensive periventricular and subcortical white matter hypoattenuation is evident bilaterally. Ventricular size is proportionate to the degree of atrophy. Atherosclerotic calcifications are present within the cavernous internal carotid arteries and at the dural margin of the vertebral arteries  Calvarium and skull base: Extensive metallic artifact is noted from a mandibular prosthesis. The calvarium is intact.  Paranasal sinuses: Clear  Orbits: Unremarkable.  CTA NECK  Aortic arch: There is a common origin of the left common carotid artery in the innominate artery. Dense atherosclerotic calcifications are present in the aortic arch the and origins the great vessels without a significant stenosis.  Right carotid system: The right common carotid  artery is within normal limits. Dense atherosclerotic calcifications are present within the proximal right internal carotid artery without a significant stenosis of greater than 50% relative to the more distal vessel. There is moderate tortuosity of the cervical right ICA without a significant stenosis.  Left carotid system: The left common carotid artery is tortuous proximally with atherosclerotic calcifications but no significant stenoses. Dense atherosclerotic calcifications are evident at the proximal left internal carotid artery without a significant stenosis. There is marked tortuosity of the cervical left ICA.  Vertebral arteries:Both vertebral arteries originate from the subclavian arteries. There is moderate tortuosity without a significant stenosis. The right vertebral artery is slightly dominant to the left.  Skeleton: Levoconvex curvature is present in the upper thoracic spine with compensatory rightward curvature in the cervical spine. Advanced facet changes are noted. Endplate changes are most pronounced on the left at C3-4 with osseous foraminal narrowing. No focal lytic or blastic lesions are present.  Other neck: A thyroid goiter is more pronounced on the right with heterogeneous density and scattered calcifications. No dominant lesions are present. The goiter extends 6.5 mm cephalo caudad.  No other significant soft tissue lesions are present in the neck or upper mediastinum.  CTA HEAD  Anterior circulation: Atherosclerotic calcifications are present within the cavernous internal carotid arteries bilaterally without a significant stenosis. 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 The right M1 segment is normal. The right A1 segment is aplastic. Both ACA vessels fill from the left. The proximal left M1 segment is unremarkable. The left MCA is occluded at the bifurcation with filling of only the most anterior branch. There is marked attenuation of  an inferior posterior branch. The other MCA branch vessels are occluded with some pial collaterals.  Posterior circulation: The right vertebral artery is the dominant vessel. From the left vertebral artery bifurcates at the PICA ascending a small branch to the vertebrobasilar junction. A focal calcification is present at the vertebrobasilar junction. The right PICA is not visualized. The right AICA is dominant. Both posterior cerebral arteries originate from the basilar tip. The PCA branch vessels are intact.  Venous sinuses: The dural sinuses are patent.  Anatomic variants: None  Delayed phase: No pathologic enhancement is evident.  IMPRESSION: 1. Occlusion of the left middle cerebral artery at the bifurcation with opacification of only the most anterior MCA branch vessels. 2. Some pial collaterals are evident. 3. Evidence of early infarct involving the anterior left MCA distribution with cortical indistinctness in decreased cortical definition in the posterior left insular cortex. The basal ganglia are intact. 4.  ASPECTS score = 7/10 5. Marked diffuse white matter hypoattenuation is evident bilaterally, compatible with chronic microvascular ischemia. 6. Extensive atherosclerotic changes involving with the carotid bifurcations bilaterally and cavernous internal carotid arteries without significant stenoses. 7. Moderate tortuosity throughout the neck. 8. Prominent thyroid goiter, more evident on the right. 9. Scoliosis. These results were called by telephone at the time of interpretation on 04/10/2014 at 12:10 pm to Dr. Leroy Kennedy , who verbally acknowledged these results.   Electronically Signed   By: Marin Roberts M.D.   On: 04/10/2014 12:50   Ct Head  Wo Contrast  04/10/2014   CLINICAL DATA:  Postop endovascular revascularization of the left middle cerebral artery earlier today.  EXAM: CT HEAD WITHOUT CONTRAST  TECHNIQUE: Contiguous axial images were obtained from the base of the skull through the vertex  without intravenous contrast.  COMPARISON:  CT angio head earlier same date.  FINDINGS: No evidence of acute hemorrhage or hematoma in the left cerebral hemisphere after revascularization of the left middle cerebral artery. No evidence of cortical infarction currently in the left middle cerebral artery distribution. Lacunar type strokes in the basal ganglia bilaterally. Severe changes of small vessel disease of the white matter diffusely, including the pons and brainstem. Moderate cortical and deep atrophy as noted earlier.  IMPRESSION: 1. No evidence of acute hemorrhage or hematoma in the left cerebral hemisphere after revascularization of the left middle cerebral artery. 2. No evidence of acute cortical infarction at this time.   Electronically Signed   By: Hulan Saas M.D.   On: 04/10/2014 17:49   Ct Angio Neck W/cm &/or Wo/cm  04/10/2014   CLINICAL DATA:  Right-sided facial weakness and droop. Right-sided weakness. Sudden onset today. The patient fell on to the foot of her call are.  EXAM: CT ANGIOGRAPHY HEAD AND NECK  TECHNIQUE: Multidetector CT imaging of the head and neck was performed using the standard protocol during bolus administration of intravenous contrast. Multiplanar CT image reconstructions and MIPs were obtained to evaluate the vascular anatomy. Carotid stenosis measurements (when applicable) are obtained utilizing NASCET criteria, using the distal internal carotid diameter as the denominator.  CONTRAST:  50mL OMNIPAQUE IOHEXOL 350 MG/ML SOLN  COMPARISON:  None.  FINDINGS: CT HEAD  Brain: The patient's head is somewhat oblique in the scanner. Indistinct gray-white differentiation is present in the anterior left frontal lobe above the level the ventricles and a superiorly. There is some indistinctness in the posterior left insular cortex as well. The basal ganglia are intact bilaterally. Extensive periventricular and subcortical white matter hypoattenuation is evident bilaterally.  Ventricular size is proportionate to the degree of atrophy. Atherosclerotic calcifications are present within the cavernous internal carotid arteries and at the dural margin of the vertebral arteries  Calvarium and skull base: Extensive metallic artifact is noted from a mandibular prosthesis. The calvarium is intact.  Paranasal sinuses: Clear  Orbits: Unremarkable.  CTA NECK  Aortic arch: There is a common origin of the left common carotid artery in the innominate artery. Dense atherosclerotic calcifications are present in the aortic arch the and origins the great vessels without a significant stenosis.  Right carotid system: The right common carotid artery is within normal limits. Dense atherosclerotic calcifications are present within the proximal right internal carotid artery without a significant stenosis of greater than 50% relative to the more distal vessel. There is moderate tortuosity of the cervical right ICA without a significant stenosis.  Left carotid system: The left common carotid artery is tortuous proximally with atherosclerotic calcifications but no significant stenoses. Dense atherosclerotic calcifications are evident at the proximal left internal carotid artery without a significant stenosis. There is marked tortuosity of the cervical left ICA.  Vertebral arteries:Both vertebral arteries originate from the subclavian arteries. There is moderate tortuosity without a significant stenosis. The right vertebral artery is slightly dominant to the left.  Skeleton: Levoconvex curvature is present in the upper thoracic spine with compensatory rightward curvature in the cervical spine. Advanced facet changes are noted. Endplate changes are most pronounced on the left at C3-4 with osseous foraminal narrowing. No  focal lytic or blastic lesions are present.  Other neck: A thyroid goiter is more pronounced on the right with heterogeneous density and scattered calcifications. No dominant lesions are present. The  goiter extends 6.5 mm cephalo caudad.  No other significant soft tissue lesions are present in the neck or upper mediastinum.  CTA HEAD  Anterior circulation: Atherosclerotic calcifications are present within the cavernous internal carotid arteries bilaterally without a significant stenosis. 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 The right M1 segment is normal. The right A1 segment is aplastic. Both ACA vessels fill from the left. The proximal left M1 segment is unremarkable. The left MCA is occluded at the bifurcation with filling of only the most anterior branch. There is marked attenuation of an inferior posterior branch. The other MCA branch vessels are occluded with some pial collaterals.  Posterior circulation: The right vertebral artery is the dominant vessel. From the left vertebral artery bifurcates at the PICA ascending a small branch to the vertebrobasilar junction. A focal calcification is present at the vertebrobasilar junction. The right PICA is not visualized. The right AICA is dominant. Both posterior cerebral arteries originate from the basilar tip. The PCA branch vessels are intact.  Venous sinuses: The dural sinuses are patent.  Anatomic variants: None  Delayed phase: No pathologic enhancement is evident.  IMPRESSION: 1. Occlusion of the left middle cerebral artery at the bifurcation with opacification of only the most anterior MCA branch vessels. 2. Some pial collaterals are evident. 3. Evidence of early infarct involving the anterior left MCA distribution with cortical indistinctness in decreased cortical definition in the posterior left insular cortex. The basal ganglia are intact. 4.  ASPECTS score = 7/10 5. Marked diffuse white matter hypoattenuation is evident bilaterally, compatible with chronic microvascular ischemia. 6. Extensive atherosclerotic changes involving with the carotid bifurcations bilaterally and cavernous internal  carotid arteries without significant stenoses. 7. Moderate tortuosity throughout the neck. 8. Prominent thyroid goiter, more evident on the right. 9. Scoliosis. These results were called by telephone at the time of interpretation on 04/10/2014 at 12:10 pm to Dr. Leroy Kennedy , who verbally acknowledged these results.   Electronically Signed   By: Marin Roberts M.D.   On: 04/10/2014 12:50   Mr Brain Wo Contrast  04/11/2014   CLINICAL DATA:  Left MCA territory stroke. Left MCA occlusion. Status post revascularization TICI 2B flow  EXAM: MRI HEAD WITHOUT CONTRAST  TECHNIQUE: Multiplanar, multiecho pulse sequences of the brain and surrounding structures were obtained without intravenous contrast.  COMPARISON:  CT head and CTA head 04/10/2014  FINDINGS: Diffusion-weighted images demonstrate a prominent left MCA territory infarct involving the left frontal operculum and insular cortex. There is cortical restricted diffusion within the left parietal lobe and along the precentral gyrus.  T2 changes are associated with the areas of acute infarction.  Moderate generalized atrophy and extensive. Ventricular white matter changes are additionally present bilaterally. Higher remote lacunar infarcts in dilated perivascular spaces within the basal ganglia.  No hemorrhage associated with the areas of acute infarction. There are several foci of susceptibility suggesting remote hemorrhage in the basal ganglia bilaterally as well as the right parietal lobe.  Flow is present in the major intracranial arteries. A right lens replacement is noted. The globes and orbits are otherwise intact. The paranasal sinuses are clear. There is some fluid in the left mastoid air cells. No obstructing nasopharyngeal lesion is evident.  IMPRESSION: 1. Acute nonhemorrhagic infarct involving the left frontal  operculum, posterior insular cortex, precentral gyrus, and scattered areas in the left parietal lobe. 2. Advanced atrophy and extensive white  matter disease represents additional long-standing chronic microvascular ischemic changes. 3. Remote lacunar infarcts of the basal ganglia bilaterally. 4. Scattered foci of susceptibility suggesting remote hemorrhage. This likely reflects underlying vasculitis or hypertensive changes.   Electronically Signed   By: Marin Roberts M.D.   On: 04/11/2014 16:22   Dg Chest Port 1 View  04/12/2014   CLINICAL DATA:  Pulmonary infiltrates  EXAM: PORTABLE CHEST - 1 VIEW  COMPARISON:  Portable exam 0927 hours compared to 04/11/2014  FINDINGS: Endotracheal and nasogastric tubes removed.  Enlargement of cardiac silhouette post TAVR.  Rotated to the RIGHT similar to previous exam.  Opacity at RIGHT apex may be related to rotation.  Calcified tortuous aorta.  Question mild atelectasis or infiltrate in RIGHT mid lung.  No gross pleural effusion or pneumothorax.  Bones demineralized.  IMPRESSION: Enlargement of cardiac silhouette.  Hazy increased RIGHT mid lung opacity question atelectasis versus infiltrate.   Electronically Signed   By: Ulyses Southward M.D.   On: 04/12/2014 09:48   Portable Chest Xray  04/11/2014   CLINICAL DATA:  Acute respiratory failure, history atrial fibrillation  EXAM: PORTABLE CHEST - 1 VIEW  COMPARISON:  Portable exam 0624 hr compared to 04/10/2014  FINDINGS: Tip of endotracheal tube at carina directed into RIGHT mainstem bronchus orifice; recommend withdrawal 2-3 cm for placement above carina.  Nasogastric tube extends into stomach.  Tortuous aorta.  Enlargement of cardiac silhouette post TAVR.  Peribronchial thickening.  Significant rotation to the RIGHT.  No definite infiltrate, pleural effusion, or pneumothorax.  IMPRESSION: Tip of endotracheal tube at carina; recommend withdrawal 2-3 cm.  Critical Value/emergent results were called by telephone at the time of interpretation on 04/11/2014 at 0829 hr to patient's nurse Megan RN on , who verbally acknowledged these results.   Electronically  Signed   By: Ulyses Southward M.D.   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  Enlargement of cardiac silhouette post TAVR.  Rotation to the RIGHT.  Tortuous aorta with atherosclerotic calcification.  No definite acute infiltrate, pleural effusion or pneumothorax.  Slight chronic accentuation of interstitial markings not significantly changed from previous exam.  Bones demineralized.  IMPRESSION: Enlargement of cardiac silhouette post TAVR.  Mild chronic interstitial changes without acute infiltrate.   Electronically Signed   By: Ulyses Southward M.D.   On: 04/10/2014 13:25   Dg Abd Portable 1v  04/10/2014   CLINICAL DATA:  81 year old female status post orogastric tube placement.  EXAM: PORTABLE ABDOMEN - 1 VIEW  COMPARISON:  No priors.  FINDINGS: An enteric tube is present with tip in the mid body of the stomach. Visualized bowel gas pattern is nonobstructive. Lower portions of the thorax are remarkable for the presence of and Edwards Sapien valve in the aortic position. Vascular stent in the region of the right external iliac vessels. Foley balloon catheter in the urinary bladder, with small amount of iodinated contrast in the urinary bladder.  IMPRESSION: 1. Tip of orogastric tube is in the body of the stomach.   Electronically Signed   By:  Trudie Reedaniel  Entrikin M.D.   On: 04/10/2014 19:29    Labs:  CBC:  Recent Labs  04/10/14 1148 04/10/14 1157 04/11/14 0400  WBC 5.4  --  6.7  HGB 11.3* 12.6 9.9*  HCT 34.3* 37.0 30.4*  PLT 171  --  148*    COAGS:  Recent Labs  04/10/14 1148  INR 1.03  APTT 28    BMP:  Recent Labs  04/10/14 1148 04/10/14 1157 04/11/14 0400 04/11/14 2001 04/12/14 0400    NA 140 140 141 141 144  K 4.3 4.2 2.9* 3.6 3.0*  CL 101 102 110 112 115*  CO2 27  --  24 22 25   GLUCOSE 103* 100* 154* 135* 120*  BUN 22 25* 11 12 13   CALCIUM 9.8  --  8.2* 8.9 8.8  CREATININE 0.93 1.00 0.62 0.66 0.73  GFRNONAA 54*  --  80* 78* 76*  GFRAA 63*  --  >90 >90 88*    LIVER FUNCTION TESTS:  Recent Labs  04/10/14 1148 04/11/14 0400  BILITOT 0.7 0.6  AST 26 32  ALT 16 24  ALKPHOS 71 62  PROT 7.1 5.6*  ALBUMIN 3.9 2.9*    Assessment and Plan:  CVA L MCA IA/tpa/clot retrieval 3/12 Revascularization Non verbal; not really focusing Will report to Dr Corliss Skainseveshwar  Signed: Ralene MuskratURPIN,Demonica Farrey A 04/12/2014, 11:52 AM   I spent a total of 15 Minutes in face to face in clinical consultation/evaluation, greater than 50% of which was counseling/coordinating care for CVA; revascularization L MCA

## 2014-04-13 ENCOUNTER — Inpatient Hospital Stay (HOSPITAL_COMMUNITY): Payer: Medicare Other

## 2014-04-13 DIAGNOSIS — I63132 Cerebral infarction due to embolism of left carotid artery: Secondary | ICD-10-CM

## 2014-04-13 LAB — BASIC METABOLIC PANEL
ANION GAP: 6 (ref 5–15)
ANION GAP: 7 (ref 5–15)
BUN: 17 mg/dL (ref 6–23)
BUN: 20 mg/dL (ref 6–23)
CALCIUM: 9.6 mg/dL (ref 8.4–10.5)
CHLORIDE: 111 mmol/L (ref 96–112)
CO2: 27 mmol/L (ref 19–32)
CO2: 29 mmol/L (ref 19–32)
CREATININE: 0.62 mg/dL (ref 0.50–1.10)
Calcium: 9 mg/dL (ref 8.4–10.5)
Chloride: 110 mmol/L (ref 96–112)
Creatinine, Ser: 0.69 mg/dL (ref 0.50–1.10)
GFR calc non Af Amer: 80 mL/min — ABNORMAL LOW (ref >90)
GFR, EST AFRICAN AMERICAN: 89 mL/min — AB (ref >90)
GFR, EST NON AFRICAN AMERICAN: 77 mL/min — AB (ref >90)
GLUCOSE: 175 mg/dL — AB (ref 70–99)
GLUCOSE: 159 mg/dL — AB (ref 70–99)
POTASSIUM: 2.6 mmol/L — AB (ref 3.5–5.1)
Potassium: 3.3 mmol/L — ABNORMAL LOW (ref 3.5–5.1)
Sodium: 144 mmol/L (ref 135–145)
Sodium: 146 mmol/L — ABNORMAL HIGH (ref 135–145)

## 2014-04-13 LAB — CBC
HCT: 29.8 % — ABNORMAL LOW (ref 36.0–46.0)
Hemoglobin: 9.5 g/dL — ABNORMAL LOW (ref 12.0–15.0)
MCH: 26.5 pg (ref 26.0–34.0)
MCHC: 31.9 g/dL (ref 30.0–36.0)
MCV: 83.2 fL (ref 78.0–100.0)
Platelets: 169 10*3/uL (ref 150–400)
RBC: 3.58 MIL/uL — ABNORMAL LOW (ref 3.87–5.11)
RDW: 15.3 % (ref 11.5–15.5)
WBC: 8.5 10*3/uL (ref 4.0–10.5)

## 2014-04-13 LAB — MAGNESIUM: Magnesium: 2.4 mg/dL (ref 1.5–2.5)

## 2014-04-13 LAB — GLUCOSE, CAPILLARY
GLUCOSE-CAPILLARY: 138 mg/dL — AB (ref 70–99)
GLUCOSE-CAPILLARY: 155 mg/dL — AB (ref 70–99)
Glucose-Capillary: 131 mg/dL — ABNORMAL HIGH (ref 70–99)
Glucose-Capillary: 152 mg/dL — ABNORMAL HIGH (ref 70–99)
Glucose-Capillary: 171 mg/dL — ABNORMAL HIGH (ref 70–99)
Glucose-Capillary: 187 mg/dL — ABNORMAL HIGH (ref 70–99)

## 2014-04-13 LAB — TSH: TSH: 0.379 u[IU]/mL (ref 0.350–4.500)

## 2014-04-13 LAB — PHOSPHORUS: PHOSPHORUS: 2.6 mg/dL (ref 2.3–4.6)

## 2014-04-13 LAB — T4, FREE: Free T4: 1.38 ng/dL (ref 0.80–1.80)

## 2014-04-13 MED ORDER — POTASSIUM CHLORIDE 10 MEQ/100ML IV SOLN
10.0000 meq | INTRAVENOUS | Status: AC
  Administered 2014-04-13 (×2): 10 meq via INTRAVENOUS
  Filled 2014-04-13 (×2): qty 100

## 2014-04-13 MED ORDER — POTASSIUM CHLORIDE 10 MEQ/100ML IV SOLN
10.0000 meq | Freq: Once | INTRAVENOUS | Status: AC
  Administered 2014-04-13: 10 meq via INTRAVENOUS
  Filled 2014-04-13: qty 100

## 2014-04-13 MED ORDER — BISACODYL 10 MG RE SUPP
10.0000 mg | Freq: Once | RECTAL | Status: AC
  Administered 2014-04-13: 10 mg via RECTAL
  Filled 2014-04-13: qty 1

## 2014-04-13 MED ORDER — POTASSIUM CHLORIDE 20 MEQ/15ML (10%) PO SOLN
40.0000 meq | ORAL | Status: AC
  Administered 2014-04-13 (×3): 40 meq
  Filled 2014-04-13 (×4): qty 30

## 2014-04-13 MED ORDER — FUROSEMIDE 40 MG PO TABS
40.0000 mg | ORAL_TABLET | Freq: Once | ORAL | Status: AC
  Administered 2014-04-13: 40 mg via ORAL

## 2014-04-13 MED ORDER — FUROSEMIDE 40 MG PO TABS
40.0000 mg | ORAL_TABLET | Freq: Two times a day (BID) | ORAL | Status: DC
  Administered 2014-04-13: 40 mg via ORAL
  Filled 2014-04-13 (×2): qty 1

## 2014-04-13 NOTE — Clinical Social Work Placement (Signed)
Clinical Social Work Department CLINICAL SOCIAL WORK PLACEMENT NOTE 04/13/2014  Patient:  Eilene GhaziFLORENCE,Tyshia J  Account Number:  [id number] Admit date:  04/10/2014  Clinical Social Worker:  Macario GoldsJESSE Sosha Shepherd, LCSW  Date/time:  04/13/2014 11:45 AM  Clinical Social Work is seeking post-discharge placement for this patient at the following level of care:   SKILLED NURSING   (*CSW will update this form in Epic as items are completed)   04/13/2014  [facility]family provided with [facility]'s list of facilities offering this level of care within the geographic area requested by the patient (or if unable, by the patient's family).  04/13/2014  Patient/family informed of their freedom to choose among providers that offer the needed level of care, that participate in Medicare, Medicaid or managed care program needed by the patient, have an available bed and are willing to accept the patient.  04/13/2014  Patient/family informed of [facility]' ownership interest in [facility], as well as of the fact that they are under no obligation to receive care at this facility.  PASARR submitted to EDS on 04/13/2014 PASARR number received on 04/13/2014  FL2 transmitted to all facilities in geographic area requested by pt/family on  04/13/2014 FL2 transmitted to all facilities within larger geographic area on   Patient informed that his/her managed care company has contracts with or will negotiate with  certain facilities, including the following:     Patient/family informed of bed offers received:   Patient chooses bed at  Physician recommends and patient chooses bed at    Patient to be transferred to  on   Patient to be transferred to facility by  Patient and family notified of transfer on  Name of family member notified:    The following physician request were entered in Epic:   Additional Comments:

## 2014-04-13 NOTE — Progress Notes (Signed)
Pt arrived to 4N15 via bed from 78M.  No c/o pain or distress noted.  Settled into the room.  Call bell within reach, bed alarm activated and telemetry applied.  Son at bedside.  Will continue to monitor. Sondra ComeSilva, Dierra Riesgo M, RN

## 2014-04-13 NOTE — Progress Notes (Signed)
Speech Pathology     MBSS complete. Full report located under chart review in imaging section.     Carely Nappier Willis Orrie Schubert M.Ed CCC-SLP Pager [phone]   

## 2014-04-13 NOTE — Progress Notes (Signed)
Referring Physician(s): Dr Leroy Kennedy  Subjective:  L MCA CVA Revascularization ia tpa and clot retrieval In bed but seems improved today Much more alert Focuses on me Tries to smile Does follow some commands  Allergies: Keflex; Penicillins; Prednisone; and Sulfa antibiotics  Medications: Prior to Admission medications   Medication Sig Start Date End Date Taking? Authorizing Provider  aspirin 325 MG tablet Take 325 mg by mouth daily.   Yes Historical Provider, MD  beta carotene w/minerals (OCUVITE) tablet Take 1 tablet by mouth daily.   Yes Historical Provider, MD  DIGOX 125 MCG tablet Take 125 mcg by mouth daily. 03/15/14  Yes Historical Provider, MD  furosemide (LASIX) 40 MG tablet Take 60 mg by mouth 2 (two) times daily. 03/29/14  Yes Historical Provider, MD  lisinopril (PRINIVIL,ZESTRIL) 2.5 MG tablet Take 2.5 mg by mouth daily. 03/15/14  Yes Historical Provider, MD  methimazole (TAPAZOLE) 5 MG tablet Take 5 mg by mouth daily. 03/22/14  Yes Historical Provider, MD  metoprolol (LOPRESSOR) 100 MG tablet Take 50 mg by mouth 2 (two) times daily. 03/22/14  Yes Historical Provider, MD  omeprazole (PRILOSEC) 40 MG capsule Take 40 mg by mouth daily. 03/01/14  Yes Historical Provider, MD  potassium chloride SA (K-DUR,KLOR-CON) 20 MEQ tablet Take 30 mEq by mouth 2 (two) times daily. 04/05/14  Yes Historical Provider, MD  pravastatin (PRAVACHOL) 20 MG tablet Take 20 mg by mouth every evening. 04/05/14  Yes Historical Provider, MD  traMADol-acetaminophen (ULTRACET) 37.5-325 MG per tablet Take 1 tablet by mouth every 6 (six) hours as needed (pain).   Yes Historical Provider, MD  VENTOLIN HFA 108 (90 BASE) MCG/ACT inhaler Inhale 2 puffs into the lungs every 6 (six) hours as needed. 01/08/14  Yes Historical Provider, MD     Vital Signs: BP 151/94 mmHg  Pulse 92  Temp(Src) 98.7 F (37.1 C) (Oral)  Resp 30  Ht 5\' 2"  (1.575 m)  Wt 54.7 kg (120 lb 9.5 oz)  BMI 22.05 kg/m2  SpO2 94%  LMP  (LMP  Unknown)  Physical Exam  HENT:  R facial droop Tries to smile on command Follows me----seems to focus  Musculoskeletal:  Can move L arm and leg well No real movement on Rt  Neurological:  More alert Non verbal    Imaging: Ct Angio Head W/cm &/or Wo Cm  04/10/2014   CLINICAL DATA:  Right-sided facial weakness and droop. Right-sided weakness. Sudden onset today. The patient fell on to the foot of her call are.  EXAM: CT ANGIOGRAPHY HEAD AND NECK  TECHNIQUE: Multidetector CT imaging of the head and neck was performed using the standard protocol during bolus administration of intravenous contrast. Multiplanar CT image reconstructions and MIPs were obtained to evaluate the vascular anatomy. Carotid stenosis measurements (when applicable) are obtained utilizing NASCET criteria, using the distal internal carotid diameter as the denominator.  CONTRAST:  50mL OMNIPAQUE IOHEXOL 350 MG/ML SOLN  COMPARISON:  None.  FINDINGS: CT HEAD  Brain: The patient's head is somewhat oblique in the scanner. Indistinct gray-white differentiation is present in the anterior left frontal lobe above the level the ventricles and a superiorly. There is some indistinctness in the posterior left insular cortex as well. The basal ganglia are intact bilaterally. Extensive periventricular and subcortical white matter hypoattenuation is evident bilaterally. Ventricular size is proportionate to the degree of atrophy. Atherosclerotic calcifications are present within the cavernous internal carotid arteries and at the dural margin of the vertebral arteries  Calvarium and skull base:  Extensive metallic artifact is noted from a mandibular prosthesis. The calvarium is intact.  Paranasal sinuses: Clear  Orbits: Unremarkable.  CTA NECK  Aortic arch: There is a common origin of the left common carotid artery in the innominate artery. Dense atherosclerotic calcifications are present in the aortic arch the and origins the great vessels without a  significant stenosis.  Right carotid system: The right common carotid artery is within normal limits. Dense atherosclerotic calcifications are present within the proximal right internal carotid artery without a significant stenosis of greater than 50% relative to the more distal vessel. There is moderate tortuosity of the cervical right ICA without a significant stenosis.  Left carotid system: The left common carotid artery is tortuous proximally with atherosclerotic calcifications but no significant stenoses. Dense atherosclerotic calcifications are evident at the proximal left internal carotid artery without a significant stenosis. There is marked tortuosity of the cervical left ICA.  Vertebral arteries:Both vertebral arteries originate from the subclavian arteries. There is moderate tortuosity without a significant stenosis. The right vertebral artery is slightly dominant to the left.  Skeleton: Levoconvex curvature is present in the upper thoracic spine with compensatory rightward curvature in the cervical spine. Advanced facet changes are noted. Endplate changes are most pronounced on the left at C3-4 with osseous foraminal narrowing. No focal lytic or blastic lesions are present.  Other neck: A thyroid goiter is more pronounced on the right with heterogeneous density and scattered calcifications. No dominant lesions are present. The goiter extends 6.5 mm cephalo caudad.  No other significant soft tissue lesions are present in the neck or upper mediastinum.  CTA HEAD  Anterior circulation: Atherosclerotic calcifications are present within the cavernous internal carotid arteries bilaterally without a significant stenosis. 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 The right M1 segment is normal. The right A1 segment is aplastic. Both ACA vessels fill from the left. The proximal left M1 segment is unremarkable. The left MCA is occluded at the bifurcation with  filling of only the most anterior branch. There is marked attenuation of an inferior posterior branch. The other MCA branch vessels are occluded with some pial collaterals.  Posterior circulation: The right vertebral artery is the dominant vessel. From the left vertebral artery bifurcates at the PICA ascending a small branch to the vertebrobasilar junction. A focal calcification is present at the vertebrobasilar junction. The right PICA is not visualized. The right AICA is dominant. Both posterior cerebral arteries originate from the basilar tip. The PCA branch vessels are intact.  Venous sinuses: The dural sinuses are patent.  Anatomic variants: None  Delayed phase: No pathologic enhancement is evident.  IMPRESSION: 1. Occlusion of the left middle cerebral artery at the bifurcation with opacification of only the most anterior MCA branch vessels. 2. Some pial collaterals are evident. 3. Evidence of early infarct involving the anterior left MCA distribution with cortical indistinctness in decreased cortical definition in the posterior left insular cortex. The basal ganglia are intact. 4.  ASPECTS score = 7/10 5. Marked diffuse white matter hypoattenuation is evident bilaterally, compatible with chronic microvascular ischemia. 6. Extensive atherosclerotic changes involving with the carotid bifurcations bilaterally and cavernous internal carotid arteries without significant stenoses. 7. Moderate tortuosity throughout the neck. 8. Prominent thyroid goiter, more evident on the right. 9. Scoliosis. These results were called by telephone at the time of interpretation on 04/10/2014 at 12:10 pm to Dr. Leroy Kennedy , who verbally acknowledged these results.   Electronically Signed  By: Marin Roberts M.D.   On: 04/10/2014 12:50   Ct Head Wo Contrast  04/10/2014   CLINICAL DATA:  Postop endovascular revascularization of the left middle cerebral artery earlier today.  EXAM: CT HEAD WITHOUT CONTRAST  TECHNIQUE: Contiguous  axial images were obtained from the base of the skull through the vertex without intravenous contrast.  COMPARISON:  CT angio head earlier same date.  FINDINGS: No evidence of acute hemorrhage or hematoma in the left cerebral hemisphere after revascularization of the left middle cerebral artery. No evidence of cortical infarction currently in the left middle cerebral artery distribution. Lacunar type strokes in the basal ganglia bilaterally. Severe changes of small vessel disease of the white matter diffusely, including the pons and brainstem. Moderate cortical and deep atrophy as noted earlier.  IMPRESSION: 1. No evidence of acute hemorrhage or hematoma in the left cerebral hemisphere after revascularization of the left middle cerebral artery. 2. No evidence of acute cortical infarction at this time.   Electronically Signed   By: Hulan Saas M.D.   On: 04/10/2014 17:49   Ct Angio Neck W/cm &/or Wo/cm  04/10/2014   CLINICAL DATA:  Right-sided facial weakness and droop. Right-sided weakness. Sudden onset today. The patient fell on to the foot of her call are.  EXAM: CT ANGIOGRAPHY HEAD AND NECK  TECHNIQUE: Multidetector CT imaging of the head and neck was performed using the standard protocol during bolus administration of intravenous contrast. Multiplanar CT image reconstructions and MIPs were obtained to evaluate the vascular anatomy. Carotid stenosis measurements (when applicable) are obtained utilizing NASCET criteria, using the distal internal carotid diameter as the denominator.  CONTRAST:  50mL OMNIPAQUE IOHEXOL 350 MG/ML SOLN  COMPARISON:  None.  FINDINGS: CT HEAD  Brain: The patient's head is somewhat oblique in the scanner. Indistinct gray-white differentiation is present in the anterior left frontal lobe above the level the ventricles and a superiorly. There is some indistinctness in the posterior left insular cortex as well. The basal ganglia are intact bilaterally. Extensive periventricular and  subcortical white matter hypoattenuation is evident bilaterally. Ventricular size is proportionate to the degree of atrophy. Atherosclerotic calcifications are present within the cavernous internal carotid arteries and at the dural margin of the vertebral arteries  Calvarium and skull base: Extensive metallic artifact is noted from a mandibular prosthesis. The calvarium is intact.  Paranasal sinuses: Clear  Orbits: Unremarkable.  CTA NECK  Aortic arch: There is a common origin of the left common carotid artery in the innominate artery. Dense atherosclerotic calcifications are present in the aortic arch the and origins the great vessels without a significant stenosis.  Right carotid system: The right common carotid artery is within normal limits. Dense atherosclerotic calcifications are present within the proximal right internal carotid artery without a significant stenosis of greater than 50% relative to the more distal vessel. There is moderate tortuosity of the cervical right ICA without a significant stenosis.  Left carotid system: The left common carotid artery is tortuous proximally with atherosclerotic calcifications but no significant stenoses. Dense atherosclerotic calcifications are evident at the proximal left internal carotid artery without a significant stenosis. There is marked tortuosity of the cervical left ICA.  Vertebral arteries:Both vertebral arteries originate from the subclavian arteries. There is moderate tortuosity without a significant stenosis. The right vertebral artery is slightly dominant to the left.  Skeleton: Levoconvex curvature is present in the upper thoracic spine with compensatory rightward curvature in the cervical spine. Advanced facet changes are noted. Endplate  changes are most pronounced on the left at C3-4 with osseous foraminal narrowing. No focal lytic or blastic lesions are present.  Other neck: A thyroid goiter is more pronounced on the right with heterogeneous density  and scattered calcifications. No dominant lesions are present. The goiter extends 6.5 mm cephalo caudad.  No other significant soft tissue lesions are present in the neck or upper mediastinum.  CTA HEAD  Anterior circulation: Atherosclerotic calcifications are present within the cavernous internal carotid arteries bilaterally without a significant stenosis. 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 The right M1 segment is normal. The right A1 segment is aplastic. Both ACA vessels fill from the left. The proximal left M1 segment is unremarkable. The left MCA is occluded at the bifurcation with filling of only the most anterior branch. There is marked attenuation of an inferior posterior branch. The other MCA branch vessels are occluded with some pial collaterals.  Posterior circulation: The right vertebral artery is the dominant vessel. From the left vertebral artery bifurcates at the PICA ascending a small branch to the vertebrobasilar junction. A focal calcification is present at the vertebrobasilar junction. The right PICA is not visualized. The right AICA is dominant. Both posterior cerebral arteries originate from the basilar tip. The PCA branch vessels are intact.  Venous sinuses: The dural sinuses are patent.  Anatomic variants: None  Delayed phase: No pathologic enhancement is evident.  IMPRESSION: 1. Occlusion of the left middle cerebral artery at the bifurcation with opacification of only the most anterior MCA branch vessels. 2. Some pial collaterals are evident. 3. Evidence of early infarct involving the anterior left MCA distribution with cortical indistinctness in decreased cortical definition in the posterior left insular cortex. The basal ganglia are intact. 4.  ASPECTS score = 7/10 5. Marked diffuse white matter hypoattenuation is evident bilaterally, compatible with chronic microvascular ischemia. 6. Extensive atherosclerotic changes involving with  the carotid bifurcations bilaterally and cavernous internal carotid arteries without significant stenoses. 7. Moderate tortuosity throughout the neck. 8. Prominent thyroid goiter, more evident on the right. 9. Scoliosis. These results were called by telephone at the time of interpretation on 04/10/2014 at 12:10 pm to Dr. Leroy Kennedy , who verbally acknowledged these results.   Electronically Signed   By: Marin Roberts M.D.   On: 04/10/2014 12:50   Mr Brain Wo Contrast  04/11/2014   CLINICAL DATA:  Left MCA territory stroke. Left MCA occlusion. Status post revascularization TICI 2B flow  EXAM: MRI HEAD WITHOUT CONTRAST  TECHNIQUE: Multiplanar, multiecho pulse sequences of the brain and surrounding structures were obtained without intravenous contrast.  COMPARISON:  CT head and CTA head 04/10/2014  FINDINGS: Diffusion-weighted images demonstrate a prominent left MCA territory infarct involving the left frontal operculum and insular cortex. There is cortical restricted diffusion within the left parietal lobe and along the precentral gyrus.  T2 changes are associated with the areas of acute infarction.  Moderate generalized atrophy and extensive. Ventricular white matter changes are additionally present bilaterally. Higher remote lacunar infarcts in dilated perivascular spaces within the basal ganglia.  No hemorrhage associated with the areas of acute infarction. There are several foci of susceptibility suggesting remote hemorrhage in the basal ganglia bilaterally as well as the right parietal lobe.  Flow is present in the major intracranial arteries. A right lens replacement is noted. The globes and orbits are otherwise intact. The paranasal sinuses are clear. There is some fluid in the left mastoid air cells. No obstructing  nasopharyngeal lesion is evident.  IMPRESSION: 1. Acute nonhemorrhagic infarct involving the left frontal operculum, posterior insular cortex, precentral gyrus, and scattered areas in the left  parietal lobe. 2. Advanced atrophy and extensive white matter disease represents additional long-standing chronic microvascular ischemic changes. 3. Remote lacunar infarcts of the basal ganglia bilaterally. 4. Scattered foci of susceptibility suggesting remote hemorrhage. This likely reflects underlying vasculitis or hypertensive changes.   Electronically Signed   By: Marin Robertshristopher  Mattern M.D.   On: 04/11/2014 16:22   Dg Chest Port 1 View  04/13/2014   CLINICAL DATA:  Recent left MCA occlusion, atrial fibrillation  EXAM: PORTABLE CHEST - 1 VIEW  COMPARISON:  04/12/2014  FINDINGS: Cardiac shadow is stable. Changes consistent with TAVR are again noted. Patient is significantly rotated to the right accentuating the mediastinal markings. Improved aeration is noted in the right lung when compare with the previous exam. Persistent right peritracheal density is noted consistent with prominent thyroid gland as on previous CT. Mild vascular congestion remains. Feeding catheter is noted extending into the stomach.  IMPRESSION: Interval improved aeration in the right lung.  New feeding catheter extending into the stomach.  The remainder of the exam is stable.   Electronically Signed   By: Alcide CleverMark  Lukens M.D.   On: 04/13/2014 08:20   Dg Chest Port 1 View  04/12/2014   CLINICAL DATA:  Pulmonary infiltrates  EXAM: PORTABLE CHEST - 1 VIEW  COMPARISON:  Portable exam 0927 hours compared to 04/11/2014  FINDINGS: Endotracheal and nasogastric tubes removed.  Enlargement of cardiac silhouette post TAVR.  Rotated to the RIGHT similar to previous exam.  Opacity at RIGHT apex may be related to rotation.  Calcified tortuous aorta.  Question mild atelectasis or infiltrate in RIGHT mid lung.  No gross pleural effusion or pneumothorax.  Bones demineralized.  IMPRESSION: Enlargement of cardiac silhouette.  Hazy increased RIGHT mid lung opacity question atelectasis versus infiltrate.   Electronically Signed   By: Ulyses SouthwardMark  Boles M.D.   On:  04/12/2014 09:48   Portable Chest Xray  04/11/2014   CLINICAL DATA:  Acute respiratory failure, history atrial fibrillation  EXAM: PORTABLE CHEST - 1 VIEW  COMPARISON:  Portable exam 0624 hr compared to 04/10/2014  FINDINGS: Tip of endotracheal tube at carina directed into RIGHT mainstem bronchus orifice; recommend withdrawal 2-3 cm for placement above carina.  Nasogastric tube extends into stomach.  Tortuous aorta.  Enlargement of cardiac silhouette post TAVR.  Peribronchial thickening.  Significant rotation to the RIGHT.  No definite infiltrate, pleural effusion, or pneumothorax.  IMPRESSION: Tip of endotracheal tube at carina; recommend withdrawal 2-3 cm.  Critical Value/emergent results were called by telephone at the time of interpretation on 04/11/2014 at 0829 hr to patient's nurse Megan RN on [facility], who verbally acknowledged these results.   Electronically Signed   By: Ulyses SouthwardMark  Boles M.D.   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  Enlargement of cardiac silhouette post TAVR.  Rotation to the RIGHT.  Tortuous aorta with atherosclerotic calcification.  No definite acute infiltrate, pleural effusion or pneumothorax.  Slight chronic accentuation of interstitial markings not significantly changed from previous exam.  Bones demineralized.  IMPRESSION: Enlargement  of cardiac silhouette post TAVR.  Mild chronic interstitial changes without acute infiltrate.   Electronically Signed   By: Ulyses Southward M.D.   On: 04/10/2014 13:25   Dg Abd Portable 1v  04/12/2014   CLINICAL DATA:  NG tube placement.  EXAM: PORTABLE ABDOMEN - 1 VIEW  COMPARISON:  04/10/2014  FINDINGS: Previous enteric tube has been removed. A  weighted enteric tube has been placed with tip projecting over the gastric body. Gas is present nondilated loops of bowel without evidence of obstruction. Right femoral catheter has been removed. Right external iliac region vascular stent is again noted. Evaluation of the lung bases is limited by patient rotation, with prior aortic valve replacement again noted. Thoracolumbar scoliosis is again seen.  IMPRESSION: Weighted enteric tube tip overlying the gastric body.   Electronically Signed   By: Sebastian Ache   On: 04/12/2014 13:30   Dg Abd Portable 1v  04/10/2014   CLINICAL DATA:  81 year old female status post orogastric tube placement.  EXAM: PORTABLE ABDOMEN - 1 VIEW  COMPARISON:  No priors.  FINDINGS: An enteric tube is present with tip in the mid body of the stomach. Visualized bowel gas pattern is nonobstructive. Lower portions of the thorax are remarkable for the presence of and Edwards Sapien valve in the aortic position. Vascular stent in the region of the right external iliac vessels. Foley balloon catheter in the urinary bladder, with small amount of iodinated contrast in the urinary bladder.  IMPRESSION: 1. Tip of orogastric tube is in the body of the stomach.   Electronically Signed   By: Trudie Reed M.D.   On: 04/10/2014 19:29   Dg Swallowing Func-speech Pathology  04/13/2014    Objective Swallowing Evaluation:    Patient Details  Name: [name] MRN: [id number] Date of Birth: 14-Sep-1926  Today's Date: 04/13/2014 Time: SLP Start Time (ACUTE ONLY): 1130-SLP Stop Time (ACUTE ONLY): 1143 SLP Time Calculation (min) (ACUTE ONLY): 13 min  Past Medical History:  Past Medical History  Diagnosis Date  . Atrial fibrillation    Past Surgical History:  Past Surgical History  Procedure Laterality Date  . Radiology with anesthesia N/A 04/10/2014    Procedure: RADIOLOGY WITH ANESTHESIA;  Surgeon: Julieanne Cotton, MD;   Location: [facility] OR;  Service: Radiology;  Laterality: N/A;   HPI:  HPI: 81 year old  female, PMH A-fib admitted falling against her vehicle  while pumping gas with right-sided facial droop, right-sided  hemiparesis.Intubated 3/12-3/13. MRI acute nonhemorrhagic infarct  involving the left frontal operculum, posterior insular cortex, precentral  gyrus, and scattered areas in the left parietal lobe, remote lacunar  infarcts of the basal ganglia bilaterally. CXR 3/14 hazy increased RIGHT  mid lung opacity question atelectasis versus infiltrate.  No Data Recorded  Assessment / Plan / Recommendation [facility] IP CLINICAL IMPRESSIONS 04/13/2014  Dysphagia Diagnosis Moderate pharyngeal phase dysphagia  Clinical impression Pt exhibits moderate pharyngeal dysphagia with  potential for significant aspiration. Decreased pharyngeal sensation led  to initiation at valleculae with poor lingual retraction resulting in  mod-max vallecular residue. Pt able to produce volitional dry swallow with  verbal cues that were ineffective to significantly reduce residue. Silent  laryngeal penetration during the swallow of puree due to decreased  laryngeal elevation and protection. Recommend continue nutrition via  currently placed PANDA tube. Increased ability to follow commands today  with cues.ST will continue dysphagia intervention.        CH[facility] IP TREATMENT RECOMMENDATION 04/13/2014  Treatment Plan Recommendations Therapy as outlined  in treatment plan below      [facility] IP DIET RECOMMENDATION 04/13/2014  Diet Recommendations NPO  Liquid Administration via (None)  Medication Administration Via alternative means  Compensations (None)  Postural Changes and/or Swallow Maneuvers (None)     CH[facility] IP OTHER RECOMMENDATIONS 04/13/2014  Recommended Consults (None)  Oral Care Recommendations (No Data)  Other Recommendations (None)     [facility] IP FOLLOW UP RECOMMENDATIONS 04/13/2014  Follow up Recommendations Skilled Nursing facility     [facility] IP FREQUENCY AND DURATION 04/13/2014  Speech Therapy Frequency (ACUTE ONLY) min 2x/week  Treatment Duration 2 weeks      Pertinent Vitals/Pain none    SLP Swallow Goals No flowsheet data found.  No flowsheet data found.    CH[facility] IP REASON FOR REFERRAL 04/13/2014  Reason for Referral Objectively evaluate swallowing function     [facility] IP ORAL PHASE 04/13/2014  Lips (None)  Tongue (None)  Mucous membranes (None)  Nutritional status (None)  Other (None)  Oxygen therapy (None)  Oral Phase Impaired  Oral - Pudding Teaspoon (None)  Oral - Pudding Cup (None)  Oral - Honey Teaspoon (None)  Oral - Honey Cup (None)  Oral - Honey Syringe (None)  Oral - Nectar Teaspoon (None)  Oral - Nectar Cup (None)  Oral - Nectar Straw (None)  Oral - Nectar Syringe (None)  Oral - Ice Chips (None)  Oral - Thin Teaspoon (None)  Oral - Thin Cup (None)  Oral - Thin Straw (None)  Oral - Thin Syringe (None)  Oral - Puree Right pocketing in lateral sulci;Pocketing in anterior  sulcus;Delayed oral transit;Reduced posterior propulsion;Weak lingual  manipulation  Oral - Mechanical Soft (None)  Oral - Regular (None)  Oral - Multi-consistency (None)  Oral - Pill (None)  Oral Phase - Comment (None)      CHL IP PHARYNGEAL PHASE 04/13/2014  Pharyngeal Phase Impaired  Pharyngeal - Pudding Teaspoon (None)  Penetration/Aspiration details (pudding teaspoon) (None)  Pharyngeal - Pudding Cup (None)  Penetration/Aspiration details (pudding cup) (None)  Pharyngeal - Honey Teaspoon (None)  Penetration/Aspiration details (honey teaspoon) (None)  Pharyngeal - Honey Cup (None)  Penetration/Aspiration details (honey cup) (None)  Pharyngeal - Honey Syringe (None)  Penetration/Aspiration details (honey syringe) (None)  Pharyngeal - Nectar Teaspoon (None)  Penetration/Aspiration details (nectar teaspoon) (None)  Pharyngeal - Nectar Cup (None)  Penetration/Aspiration details (nectar cup) (None)  Pharyngeal - Nectar Straw (None)  Penetration/Aspiration details (nectar straw) (None)  Pharyngeal - Nectar Syringe (None)  Penetration/Aspiration details (nectar syringe) (None)  Pharyngeal - Ice Chips  (None)  Penetration/Aspiration details (ice chips) (None)  Pharyngeal - Thin Teaspoon (None)  Penetration/Aspiration details (thin teaspoon) (None)  Pharyngeal - Thin Cup (None)  Penetration/Aspiration details (thin cup) (None)  Pharyngeal - Thin Straw (None)  Penetration/Aspiration details (thin straw) (None)  Pharyngeal - Thin Syringe (None)  Penetration/Aspiration details (thin syringe') (None)  Pharyngeal - Puree Delayed swallow initiation;Premature spillage to  valleculae;Pharyngeal residue - valleculae;Reduced tongue base  retraction;Penetration/Aspiration during swallow  Penetration/Aspiration details (puree) Material enters airway, remains  ABOVE vocal cords and not ejected out  Pharyngeal - Mechanical Soft (None)  Penetration/Aspiration details (mechanical soft) (None)  Pharyngeal - Regular (None)  Penetration/Aspiration details (regular) (None)  Pharyngeal - Multi-consistency (None)  Penetration/Aspiration details (multi-consistency) (None)  Pharyngeal - Pill (None)  Penetration/Aspiration details (pill) (None)  Pharyngeal Comment (None)     CHL IP CERVICAL ESOPHAGEAL PHASE 04/13/2014  Cervical Esophageal Phase WFL  Pudding Teaspoon (None)  Pudding Cup (None)  Honey Teaspoon (None)  Honey Cup (None)  Honey Syringe (None)  Nectar Teaspoon (None)  Nectar Cup (None)  Nectar Straw (None)  Nectar Syringe (None)  Thin Teaspoon (None)  Thin Cup (None)  Thin Straw (None)  Thin Syringe (None)  Cervical Esophageal Comment (None)    No flowsheet data found.         Royce Macadamia 04/13/2014, 12:19 PM   Breck Coons Lonell Face.Ed CCC-SLP Pager [phone]       Labs:  CBC:  Recent Labs  04/10/14 1148 04/10/14 1157 04/11/14 0400  WBC 5.4  --  6.7  HGB 11.3* 12.6 9.9*  HCT 34.3* 37.0 30.4*  PLT 171  --  148*    COAGS:  Recent Labs  04/10/14 1148  INR 1.03  APTT 28    BMP:  Recent Labs  04/11/14 0400 04/11/14 2001 04/12/14 0400 04/13/14 0219  NA 141 141 144 144  K 2.9* 3.6 3.0* 2.6*    CL 110 112 115* 111  CO2 24 22 25 27   GLUCOSE 154* 135* 120* 175*  BUN 11 12 13 17   CALCIUM 8.2* 8.9 8.8 9.0  CREATININE 0.62 0.66 0.73 0.62  GFRNONAA 80* 78* 76* 80*  GFRAA >90 >90 88* >90    LIVER FUNCTION TESTS:  Recent Labs  04/10/14 1148 04/11/14 0400  BILITOT 0.7 0.6  AST 26 32  ALT 16 24  ALKPHOS 71 62  PROT 7.1 5.6*  ALBUMIN 3.9 2.9*    Assessment and Plan:  L MCA CVA Revasc L MCA ia tpa/clot retrieval Some better Will report to Dr Corliss Skains  Signed: Ralene Muskrat A 04/13/2014, 2:03 PM   I spent a total of 15 Minutes in face to face in clinical consultation/evaluation, greater than 50% of which was counseling/coordinating care for L MCA CVA

## 2014-04-13 NOTE — Progress Notes (Signed)
[facility] FOR AM LAB REPLACEMENT ONLY  The patient does apply for the [facility] Electrolyte [facility]ment Protocol based on the criteria listed below:   1. Is GFR >/= 40 ml/min? Yes.    Patient's GFR today is 80 2. Is urine output >/= 0.5 ml/kg/hr for the last 6 hours? Yes.   Patient's UOP is 1.4 ml/kg/hr 3. Is BUN < 60 mg/dL? Yes.    Patient's BUN today is 17 4. Abnormal electrolyte(s): K 2.6 5. Ordered repletion with: per protocol 6. If a panic level lab has been reported, has the CCM MD in charge been notified? Yes.  .   Physician:  Dr Pedro Earlseterding  Jossette Zirbel A 04/13/2014 3:20 AM

## 2014-04-13 NOTE — Progress Notes (Signed)
Speech Language Pathology Patient Details Name: [name] MRN: [id number] DOB: Jun 23, 1926 Today's Date: 04/13/2014 Time:  -      MBS scheduled for today at 11:30.     Breck CoonsLisa Willis Homer GlenLitaker M.Ed [employer]P Pager [phone]
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
## 2014-04-13 NOTE — Progress Notes (Signed)
CRITICAL VALUE ALERT  Critical value received:  K: 2.6  Date of notification:  04/13/2014  Time of notification:  0315  Critical value read back:Yes.    Nurse who received alert:  Fransisco HertzJones, Maurion Walkowiak D, RN   MD notified (1st page):  Alfonso EllisGretchin, RN with [facility]  Time of first page:  0317  MD notified (2nd page):  Time of second page:  Responding MD:    Time MD responded:    Reported to Columbia Memorial HospitalGretchin RN, with [facility]. Reported previous K readings, treatment, medicine given through day, and urine output. Will await for order for K+

## 2014-04-13 NOTE — Clinical Social Work Note (Signed)
Clinical Social Work Department BRIEF PSYCHOSOCIAL ASSESSMENT 04/13/2014  Patient:  [name], [name]     Account Number:  [id number]     Admit date:  04/10/2014  Clinical Social Worker:  Myles Lipps  Date/Time:  04/13/2014 11:45 AM  Referred by:  Physician  Date Referred:  04/13/2014 Referred for  SNF Placement   Other Referral:   Interview type:  Family Other interview type:   Spoke with patient son Education officer, community)    PSYCHOSOCIAL DATA Living Status:  ALONE Admitted from facility:   Level of care:   Primary support name:  BROOK, GERACI   [phone] Primary support relationship to patient:  CHILD, ADULT Degree of support available:   Strong and supportive    CURRENT CONCERNS Current Concerns  Post-Acute Placement   Other Concerns:    SOCIAL WORK ASSESSMENT / PLAN Clinical Social Worker met with patient son and daughter at bedside to offer support and discuss patient needs at discharge.  Patient was living at home alone and completely independent prior to hospitalization.  Patient son states that with patient current status he does not feel that she will be able to return home at any time.  Patient son very clearly states that patient children are not able to care for patient in their home and are interested in ST-SNF with ability to transition to long term as needed.  Patient daughter lives in [place], patient son in [place], and another son in [place].  Patient HCPOA states that [facility] would be ideal but agreeable to initiate search in [place] and [place].  CSW to initiate search and follow up with patient son regarding available bed offers.  CSW remains available for support and to facilitate patient discharge needs once medically stable.   Assessment/plan status:  Psychosocial Support/Ongoing Assessment of Needs Other assessment/ plan:   Information/referral to community resources:   Holiday representative provided patient son with contact  information and will provide facility list with offers once available.    PATIENT'S/FAMILY'S RESPONSE TO PLAN OF CARE: Patient alert to person and may have a higher level of orientation, however due to aphasia, its hard to say. Patient with very supportive family who is realistic about patient potential long term needs.  Patient family verbalized understanding of CSW role and appreciation for support and involvement.

## 2014-04-14 ENCOUNTER — Inpatient Hospital Stay (HOSPITAL_COMMUNITY): Payer: Medicare Other

## 2014-04-14 DIAGNOSIS — E43 Unspecified severe protein-calorie malnutrition: Secondary | ICD-10-CM

## 2014-04-14 LAB — CBC
HEMATOCRIT: 31.3 % — AB (ref 36.0–46.0)
Hemoglobin: 9.8 g/dL — ABNORMAL LOW (ref 12.0–15.0)
MCH: 26.3 pg (ref 26.0–34.0)
MCHC: 31.3 g/dL (ref 30.0–36.0)
MCV: 84.1 fL (ref 78.0–100.0)
Platelets: 192 10*3/uL (ref 150–400)
RBC: 3.72 MIL/uL — AB (ref 3.87–5.11)
RDW: 15.5 % (ref 11.5–15.5)
WBC: 10.2 10*3/uL (ref 4.0–10.5)

## 2014-04-14 LAB — GLUCOSE, CAPILLARY
GLUCOSE-CAPILLARY: 121 mg/dL — AB (ref 70–99)
GLUCOSE-CAPILLARY: 126 mg/dL — AB (ref 70–99)
GLUCOSE-CAPILLARY: 135 mg/dL — AB (ref 70–99)
GLUCOSE-CAPILLARY: 153 mg/dL — AB (ref 70–99)
GLUCOSE-CAPILLARY: 191 mg/dL — AB (ref 70–99)
Glucose-Capillary: 134 mg/dL — ABNORMAL HIGH (ref 70–99)

## 2014-04-14 LAB — BASIC METABOLIC PANEL
Anion gap: 11 (ref 5–15)
BUN: 24 mg/dL — ABNORMAL HIGH (ref 6–23)
CO2: 27 mmol/L (ref 19–32)
CREATININE: 0.71 mg/dL (ref 0.50–1.10)
Calcium: 9.5 mg/dL (ref 8.4–10.5)
Chloride: 111 mmol/L (ref 96–112)
GFR calc Af Amer: 89 mL/min — ABNORMAL LOW (ref >90)
GFR calc non Af Amer: 76 mL/min — ABNORMAL LOW (ref >90)
Glucose, Bld: 135 mg/dL — ABNORMAL HIGH (ref 70–99)
Potassium: 3 mmol/L — ABNORMAL LOW (ref 3.5–5.1)
SODIUM: 149 mmol/L — AB (ref 135–145)

## 2014-04-14 MED ORDER — POTASSIUM CHLORIDE 10 MEQ/100ML IV SOLN
10.0000 meq | INTRAVENOUS | Status: AC
  Administered 2014-04-14 (×2): 10 meq via INTRAVENOUS
  Filled 2014-04-14 (×2): qty 100

## 2014-04-14 MED ORDER — SODIUM CHLORIDE 0.9 % IV SOLN
INTRAVENOUS | Status: DC
  Administered 2014-04-14: 21:00:00 via INTRAVENOUS

## 2014-04-14 NOTE — Progress Notes (Deleted)
Speech Language Pathology    Patient Details Name: Eilene Ghaziancy J Zapien MRN: [id number] DOB: 1926-10-18 Today's Date: 04/14/2014 Time:  -      MBS planned for today at 1300. Asher MuirJamie, charge RN getting order from MD.     Darrow BussingLisa Willis Somalia Segler M.Ed [employer] Pager [phone]
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
## 2014-04-14 NOTE — Progress Notes (Signed)
NUTRITION FOLLOW UP  DOCUMENTATION CODES Per approved criteria  -Severe malnutrition in the context of chronic illness -Underweight       Intervention:   Will continue to monitor for goals of care and nutrition needs  If NGT is replaced : Initiate Osmolite 1.2 via NGT at 20 ml/hr and increase by 10 ml every 12 hours to goal rate of 45 ml/hr  Nutrition Dx:   Inadequate oral intake related to failed swallow evaluation as evidenced by NPO status; ongoing  Goal:   Pt to meet >/= 90% of their estimated nutrition needs; not met  Monitor:   TF adequacy/tolerance, Labs, weight trends, I/O's  Assessment:   81 y/o female admitted on 3/12 s/p left MCA territory CVA. Pt was given systemic TPA in ER then went to IR for endovascular intervention and returned to ICU on vent. Extubated 3/13.   Per chart, pt pulled NGT out last night. Per MD note, family is not interested in surgical procedure for feeding tube placement and is agreeable to not replace panda tube at this time. MBS performed yesterday with recommendations for pt to remain NPO. Palliative care team has been consulted for goal of care. Pt's weight has trended up 16 lbs in the past 4 days.   Labs: low hemoglobin, elevated glucose, high sodium, low potassium  Height: Ht Readings from Last 1 Encounters:  04/10/14 _0  (1.575 m)    Weight Status:   Wt Readings from Last 1 Encounters:  04/14/14 116 lb 6.5 oz (52.8 kg)    Re-estimated needs:  Kcal: 1200-1360 Protein: 55-70 grams Fluid: >/= 1.2 L daily  Skin: closed incision on right groin  Diet Order: Diet NPO time specified   Intake/Output Summary (Last 24 hours) at 04/14/14 1506 Last data filed at 04/14/14 0700  Gross per 24 hour  Intake    135 ml  Output   1600 ml  Net  -1465 ml    Last BM: 3/16   Labs:   Recent Labs Lab 04/13/14 0219 04/13/14 2102 04/14/14 0816  NA 144 146* 149*  K 2.6* 3.3* 3.0*  CL 111 110 111  CO2 _1 BUN 17 20 24*   CREATININE 0.62 0.69 0.71  CALCIUM 9.0 9.6 9.5  MG 2.4  --   --   PHOS 2.6  --   --   GLUCOSE 175* 159* 135*    CBG (last 3)   Recent Labs  04/14/14 0411 04/14/14 0845 04/14/14 1152  GLUCAP 153* 135* 134*    Scheduled Meds: .  stroke: mapping our early stages of recovery book   Does not apply Once  . aspirin  325 mg Per NG tube Daily  . digoxin  125 mcg Oral Daily  . furosemide  40 mg Oral BID  . heparin subcutaneous  5,000 Units Subcutaneous 3 times per day  . lisinopril  2.5 mg Oral Daily  . methimazole  5 mg Per Tube Daily  . metoprolol  50 mg Oral BID  . pantoprazole (PROTONIX) IV  40 mg Intravenous QHS  . pravastatin  40 mg Per Tube q1800  . sodium chloride  500 mL Intravenous Once    Continuous Infusions:   Pryor Ochoa RD, LDN Inpatient Clinical Dietitian Pager: [phone] After Hours Pager: [phone]

## 2014-04-14 NOTE — Clinical Documentation Improvement (Signed)
Per Registered Dietician note 04/12/2014 "Patient meets criteria for severe malnutrition in the context of chronic illness as evidenced by severe muscle mass and subcutaneous body fat depletion." Ht 5'2", wt 100 lbs, BMI18.29.  Please document in your progress note and carry over to the discharge summary if you agree with Registered Dietician.    Possible Clinical Conditions: -Severe malnutrition -Moderate malnutrition -Other (please specify) -Unable to determine at present  Thank you, Doy MinceVangela Shivali Quackenbush, RN [phone] Clinical Documentation Specialist

## 2014-04-14 NOTE — Progress Notes (Signed)
Upon pt rounding, feeding tube (PANDA) noted to be removed and lying on floor. On call MD notified, advised that since no medications required per tube that orders for replacement of tube to be obtained from day shift.

## 2014-04-14 NOTE — Progress Notes (Signed)
Pt Restless multiple Telemetry alarms for tachy and Vtach periods, Pt asymptomatic other than some restlessness, vitals temp 98.3, 167/94, HR 94 irregular, RR 24 SP02 99% with [place] at 2lpm which was administered just prior to vital measurements. On call MD notified with immediate response, Potassium noted to be 3.3 Periods of Vtach noted to be 4-10 beats each occurrence. At the time of vital signs, Telemetry appeared to be A-fib rate of 94 with frequent multifocal PVC's. New order received for IV Potassium which was administered. Post infusion vitals temp 98.9, 142/98, HR 88 A-fib with frequent multifocal PVC's. No further episodes of Vtach at this time, On call notified, continuing to monitor.

## 2014-04-14 NOTE — Care Management Note (Signed)
    Page 1 of 1   04/16/2014     2:00:05 PM CARE MANAGEMENT NOTE 04/16/2014  Patient:  Eilene GhaziFLORENCE,Murrel J   Account Number:  [id number]  Date Initiated:  04/12/2014  Documentation initiated by:  Carlyle LipaBRYSON,MICHELLE  Subjective/Objective Assessment:   CVA; intubated, IR procedure and tPA     Action/Plan:   await therapy evals to determine pt's needs for next level of care at d/c   Anticipated DC Date:  04/16/2014   Anticipated DC Plan:  IP REHAB FACILITY         Choice offered to / List presented to:             Status of service:  In process, will continue to follow Medicare Important Message given?  YES (If response is "NO", the following Medicare IM given date fields will be blank) Date Medicare IM given:  04/14/2014 Medicare IM given by:  Elmer BalesOBARGE,Tinie Mcgloin Date Additional Medicare IM given:   Additional Medicare IM given by:    Discharge Disposition:  HOSPICE MEDICAL FACILITY  Per UR Regulation:  Reviewed for med. necessity/level of care/duration of stay  If discussed at Long Length of Stay Meetings, dates discussed:    Comments:  04/14/14 1520 Elmer Balesourtney Orlo Brickle RN, MSN, CM- Medicare IM letter provided.
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
## 2014-04-14 NOTE — Progress Notes (Signed)
Physical Therapy Treatment Patient Details Name: [name] MRN: [id number] DOB: 1926-11-24 Today's Date: 04/14/2014    History of Present Illness Pt admited with right-sided facial droop, right-sided hemiparesis, aphasia. PMHx: S/p aortic valve replacement, HTN, A-fib    PT Comments    Pt fatigued today and requiring max A +2 for OOB. Pt did interact with therapy and show desire to get up and move. Worked on balance and posture while EOB. HR fluctuated from 90s to 120s during session and pt with increased RR, O2 sats 91-93% on RA. Attempted incentive spirometer but pt unable to grasp concept. PT will continue to follow as family and pt desire.   Follow Up Recommendations  SNF     Equipment Recommendations  None recommended by PT    Recommendations for Other Services       Precautions / Restrictions Precautions Precautions: Fall Restrictions Weight Bearing Restrictions: No Other Position/Activity Restrictions: Watch for alignment with WB'ing due to sublux    Mobility  Bed Mobility Overal bed mobility: Needs Assistance;+2 for physical assistance Bed Mobility: Supine to Sit     Supine to sit: Max assist;+2 for physical assistance     General bed mobility comments: educationn to pt and family re: protection of RUE, tried to encourage pt to reach for RUE with left hand. Hand over hand guidance of left hand to grasp right rail. Pt able to pull self towards rail. Max A +2 for completion of rolling and for legs off bed and trunk elevation  Transfers Overall transfer level: Needs assistance Equipment used: 2 person hand held assist Transfers: Sit to/from [employer] Sit to Stand: Max assist;+2 physical assistance Stand pivot transfers: +2 physical assistance;Max assist       General transfer comment: pt somewhat lethargic today, family reports that she seems fatigued today. Required more asisstance with transfers  Ambulation/Gait              General Gait Details: unable   Stairs            Wheelchair Mobility    Modified Rankin (Stroke Patients Only) Modified Rankin (Stroke Patients Only) Pre-Morbid Rankin Score: No symptoms Modified Rankin: Severe disability     Balance Overall balance assessment: Needs assistance Sitting-balance support: Single extremity supported;Feet supported Sitting balance-Leahy Scale: Poor Sitting balance - Comments: right lean in sitting, after WB'ing on left elbow, pt able to sit with supervision for short period before becoming fatigued. Fwd flexion of trunk with increased stress on lumbar spine (pt has h/o chronic LBP) so careful to physically assist her to guard low back. Postural control: Right lateral lean   Standing balance-Leahy Scale: Zero                      Cognition Arousal/Alertness: Awake/alert Behavior During Therapy: Flat affect Overall Cognitive Status: Impaired/Different from baseline Area of Impairment: Following commands       Following Commands: Follows one step commands inconsistently;Follows one step commands with increased time       General Comments: expressive aphasia, pt follows about 50% of one step commands. Nods head and smiles occasionally throughout session    Exercises General Exercises - Lower Extremity Ankle Circles/Pumps: AROM;Left;10 reps;Supine    General Comments General comments (skin integrity, edema, etc.): pt participated well during session and interacted with therapist as she could. After session though, pt became more agitated, later assisted back to bed with nsg. Left gaze throughout session but could turn head and  eyes to right with verbal cues      Pertinent Vitals/Pain Pain Assessment:  (no signs of pain)  See impression above    Home Living                      Prior Function            PT Goals (current goals can now be found in the care plan section) Acute Rehab PT Goals Patient Stated Goal:  unable  PT Goal Formulation: With family Time For Goal Achievement: 04/26/14 Potential to Achieve Goals: Fair Progress towards PT goals: Not progressing toward goals - comment (fatigue today)    Frequency  Min 2X/week    PT Plan Frequency needs to be updated    Co-evaluation             End of Session Equipment Utilized During Treatment: Gait belt Activity Tolerance: Patient limited by fatigue Patient left: in chair;with call bell/phone within reach;with family/visitor present     Time: 0907-0930 PT Time Calculation (min) (ACUTE ONLY): 23 min  Charges:  $Therapeutic Activity: 23-37 mins                    G Codes:     Lyanne CoVictoria Jacinta Penalver, PT  Acute Rehab Services  [phone]  [place], [place] 04/14/2014, 2:00 PM

## 2014-04-15 DIAGNOSIS — Z66 Do not resuscitate: Secondary | ICD-10-CM

## 2014-04-15 DIAGNOSIS — Z515 Encounter for palliative care: Secondary | ICD-10-CM

## 2014-04-15 DIAGNOSIS — G8929 Other chronic pain: Secondary | ICD-10-CM

## 2014-04-15 LAB — GLUCOSE, CAPILLARY
GLUCOSE-CAPILLARY: 127 mg/dL — AB (ref 70–99)
GLUCOSE-CAPILLARY: 119 mg/dL — AB (ref 70–99)
Glucose-Capillary: 117 mg/dL — ABNORMAL HIGH (ref 70–99)
Glucose-Capillary: 120 mg/dL — ABNORMAL HIGH (ref 70–99)

## 2014-04-15 LAB — CBC
HCT: 31.5 % — ABNORMAL LOW (ref 36.0–46.0)
Hemoglobin: 9.7 g/dL — ABNORMAL LOW (ref 12.0–15.0)
MCH: 26.6 pg (ref 26.0–34.0)
MCHC: 30.8 g/dL (ref 30.0–36.0)
MCV: 86.3 fL (ref 78.0–100.0)
Platelets: 171 10*3/uL (ref 150–400)
RBC: 3.65 MIL/uL — ABNORMAL LOW (ref 3.87–5.11)
RDW: 15.7 % — ABNORMAL HIGH (ref 11.5–15.5)
WBC: 5.6 10*3/uL (ref 4.0–10.5)

## 2014-04-15 LAB — BASIC METABOLIC PANEL
Anion gap: 11 (ref 5–15)
BUN: 26 mg/dL — ABNORMAL HIGH (ref 6–23)
CALCIUM: 9.4 mg/dL (ref 8.4–10.5)
CO2: 28 mmol/L (ref 19–32)
Chloride: 115 mmol/L — ABNORMAL HIGH (ref 96–112)
Creatinine, Ser: 0.77 mg/dL (ref 0.50–1.10)
GFR calc Af Amer: 86 mL/min — ABNORMAL LOW (ref >90)
GFR, EST NON AFRICAN AMERICAN: 74 mL/min — AB (ref >90)
GLUCOSE: 121 mg/dL — AB (ref 70–99)
Potassium: 2.6 mmol/L — CL (ref 3.5–5.1)
Sodium: 154 mmol/L — ABNORMAL HIGH (ref 135–145)

## 2014-04-15 MED ORDER — LORAZEPAM 1 MG PO TABS: 1.0000 mg | ORAL_TABLET | ORAL | Status: DC | PRN

## 2014-04-15 MED ORDER — MORPHINE SULFATE (CONCENTRATE) 10 MG/0.5ML PO SOLN
5.0000 mg | Freq: Four times a day (QID) | ORAL | Status: DC
  Administered 2014-04-15 – 2014-04-16 (×3): 5 mg via ORAL
  Filled 2014-04-15 (×5): qty 0.5

## 2014-04-15 MED ORDER — MORPHINE SULFATE (CONCENTRATE) 10 MG/0.5ML PO SOLN
5.0000 mg | ORAL | Status: DC | PRN
  Administered 2014-04-15 – 2014-04-16 (×3): 5 mg via ORAL
  Filled 2014-04-15 (×2): qty 0.5

## 2014-04-15 MED ORDER — BISACODYL 10 MG RE SUPP: 10.0000 mg | Freq: Every day | RECTAL | Status: DC | PRN

## 2014-04-15 NOTE — Progress Notes (Signed)
CRITICAL VALUE ALERT  Critical value received:  K+ 2.6  Date of notification:  04/15/2014  Time of notification:  0805  Critical value read back:Yes.    Nurse who received alert:  Arthor CaptainHavy Nazareth Norenberg  MD notified (1st page):    Time of first page:  0815  MD notified (2nd page):  Time of second page:  Responding MD:  Dr. Roda ShuttersXu and Annie MainSharon Biby  Time MD responded:  0830    MD called back. NO order received at this time.   Sim BoastHavy, RN
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
## 2014-04-15 NOTE — Consult Note (Signed)
Patient ZO:XWRUE VANEZA West      DOB: 1926-11-09      AVW:[id number]     Consult Note from the Palliative Medicine Team at [facility]    Consult Requested by: Dr Roda Shutters     PCP: No primary care provider on file. Reason for Consultation:  Clarification of GOC and options    Phone Number:None  Assessment of patients Current state: S/P Left MCA infarct, embolic 2/2 a-fib without coagulation, long term poor prognosis. Dysphagia did not pass swallow evaluation, aphasic and family following patient's advanced directive desiring a full comfort approach.   Consult is for review of medical treatment options, clarification of goals of care and end of life issues, disposition and options, and symptom recommendation as indicated.  This NP Lorinda Creed reviewed medical records, received report from team, assessed the patient and then meet at the patient's bedside along with her son/HPOA [name] C# [phone]  to discuss diagnosis prognosis, GOC, EOL wishes disposition and options.  A detailed discussion was had today regarding advanced directives.  Concepts specific to code status, artifical feeding and hydration, continued IV antibiotics and rehospitalization was had.  The difference between a aggressive medical intervention path  and a palliative comfort care path for this patient at this time was had.  Values and goals of care important to patient and family were attempted to be elicited.  Concept of Hospice and Palliative Care were discussed  Natural trajectory and expectations at EOL were discussed.  Questions and concerns addressed.  Hard Choices booklet left for review. Family encouraged to call with questions or concerns.  PMT will continue to support holistically.   Goals of Care: 1.  Code Status: DNR/DNI  Comfort is main focus of care  2. Scope of Treatment:  No further diagnostics, artifical feeding or hydration. Comfort feeds as tolerated, symptom management.  Comfort quality and  dignity are the focus of care  3. Disposition:  Pending outcomes, evaluate oral intake (sips and chips) over the next 24 hrs, re-evaluate in am for residential hospice   4. Symptom Management:    Anxiety/Agitation:Ativan 1 mg every 4 hrs prn po/sl  Pain (underlying chronic) -Roxanol 5 mg every 6 hr po/sl scheduled                                                     Roxanol 5 mg po/sl every 1 hr prn   Bowel Regimen:  Dulcolax supp pr daily prn   5. Psychosocial:  Emotional support offered to son at bedside, he was able to express feeling so sadness and grief and difficulty coming to terms with his mother's death.  6. Spiritual:  Strong community church support   Patient Documents Completed or Given: Document Given Completed  Advanced Directives Pkt    MOST    DNR    Gone from My Sight    Hard Choices X     Brief HPI:  81 yo female with PMH of atrial fibrillation taking no anticoagulants, HTN, S/p aortic valve replacement one years ago, brought in as a code stroke via EMS due to acute onset of right hemiparesis, right facial droop, and aphasia.  CT positive for Left MCA  Infarct, right hemiplegia with global aphasia    ROS: unable to illicit due to decreased cognition   PMH:  Past Medical  History  Diagnosis Date  . Atrial fibrillation      PSH: Past Surgical History  Procedure Laterality Date  . Radiology with anesthesia N/A 04/10/2014    Procedure: RADIOLOGY WITH ANESTHESIA;  Surgeon: Julieanne CottonSanjeev Deveshwar, MD;  [facility];  Service: Radiology;  Laterality: N/A;   I have reviewed the FH and SH and  If appropriate update it with new information. Allergies  Allergen Reactions  . Keflex [Cephalexin] Other (See Comments)    Unk  . Penicillins Other (See Comments)    Unk  . Prednisone Other (See Comments)    Unk  . Sulfa Antibiotics Other (See Comments)    Unk   Scheduled Meds: .  stroke: mapping our early stages of recovery book   Does not apply Once  .  sodium chloride  500 mL Intravenous Once   Continuous Infusions: . sodium chloride 30 mL/hr at 04/14/14 2108   PRN Meds:.acetaminophen **OR** [DISCONTINUED] acetaminophen, labetalol, LORazepam, morphine CONCENTRATE    BP 157/62 mmHg  Pulse 82  Temp(Src) 98.1 F (36.7 C) (Oral)  Resp 18  Ht 5\' 2"  (1.575 m)  Wt 52.8 kg (116 lb 6.5 oz)  BMI 21.28 kg/m2  SpO2 100%  LMP  (LMP Unknown)   PPS: 20 %    Intake/Output Summary (Last 24 hours) at 04/15/14 0946 Last data filed at 04/15/14 0300  Gross per 24 hour  Intake      0 ml  Output    450 ml  Net   -450 ml     Physical Exam:  General: chronically ill appearing, eyes open makes contact, unable to verbalize needs or follow commands HEENT:  Moist buccal membranes, no exudate Chest:   Decreased in bases CVS: irregular Abdomen:soft NT +BS Ext: without edema   Labs: CBC    Component Value Date/Time   WBC 5.6 04/15/2014 0530   RBC 3.65* 04/15/2014 0530   HGB 9.7* 04/15/2014 0530   HCT 31.5* 04/15/2014 0530   PLT 171 04/15/2014 0530   MCV 86.3 04/15/2014 0530   MCH 26.6 04/15/2014 0530   MCHC 30.8 04/15/2014 0530   RDW 15.7* 04/15/2014 0530   LYMPHSABS 0.9 04/11/2014 0400   MONOABS 0.8 04/11/2014 0400   EOSABS 0.0 04/11/2014 0400   BASOSABS 0.0 04/11/2014 0400    BMET    Component Value Date/Time   NA 154* 04/15/2014 0530   K 2.6* 04/15/2014 0530   CL 115* 04/15/2014 0530   CO2 28 04/15/2014 0530   GLUCOSE 121* 04/15/2014 0530   BUN 26* 04/15/2014 0530   CREATININE 0.77 04/15/2014 0530   CALCIUM 9.4 04/15/2014 0530   GFRNONAA 74* 04/15/2014 0530   GFRAA 86* 04/15/2014 0530    CMP     Component Value Date/Time   NA 154* 04/15/2014 0530   K 2.6* 04/15/2014 0530   CL 115* 04/15/2014 0530   CO2 28 04/15/2014 0530   GLUCOSE 121* 04/15/2014 0530   BUN 26* 04/15/2014 0530   CREATININE 0.77 04/15/2014 0530   CALCIUM 9.4 04/15/2014 0530   PROT 5.6* 04/11/2014 0400   ALBUMIN 2.9* 04/11/2014 0400   AST  32 04/11/2014 0400   ALT 24 04/11/2014 0400   ALKPHOS 62 04/11/2014 0400   BILITOT 0.6 04/11/2014 0400   GFRNONAA 74* 04/15/2014 0530   GFRAA 86* 04/15/2014 0530    Time In Time Out Total Time Spent with Patient Total Overall Time  0845 1000 70 min 75 min    Greater than 50%  of  this time was spent counseling and coordinating care related to the above assessment and plan.   Lorinda Creed NP  Palliative Medicine Team Team Phone # [phone] Pager [phone]  Discussed with Annie Main NP

## 2014-04-15 NOTE — Clinical Social Work Note (Addendum)
MD considering possible residential hospice placement-unclear at this time.   CSW will continue to follow pt and pt's family for continued support and to facilitate pt's discharge needs once stable for transfer.   FL-2 and DNR form placed on chart for MD signature.   Derenda FennelBashira Lynze Reddy, MSW, LCSWA [phone] 04/15/2014 11:34 AM

## 2014-04-16 DIAGNOSIS — I63319 Cerebral infarction due to thrombosis of unspecified middle cerebral artery: Secondary | ICD-10-CM

## 2014-04-16 DIAGNOSIS — Z66 Do not resuscitate: Secondary | ICD-10-CM

## 2014-04-16 DIAGNOSIS — Z515 Encounter for palliative care: Secondary | ICD-10-CM

## 2014-04-16 NOTE — Progress Notes (Signed)
Pt was transferred to [facility] by [facility] at 1420. Several family members were present.

## 2014-04-16 NOTE — Clinical Social Work Note (Signed)
CSW made hospice referral to [facility]. [facility]. CSW informed the pt's son Windy FastRonald. CSW faxed dicharge summary to [place]. CSW arranged transport via [facility]. Bedside RN provided number to call reported.   Kashton Mcartor, MSW, LCSWA [phone]

## 2014-04-16 NOTE — Progress Notes (Signed)
Progress [facility] Pall[facility] Medicine Team at [facility]    Assessment and plan:  -son Ron at bedside  -patient is awake and able to follow commands, she is able to communicate her need for medication for headache  -she continue to be unable to take and orals, even a small ice chip the morning promote cough and drool  -focus is full comfort and hope for hospice facility (family is requesting [place], that is where her home is and all her friends, they want to be supportive)  -with full comfort path, prognosis is likely less than two weeks, will write for choice   Objective: Allergies  Allergen Reactions  . Keflex [Cephalexin] Other (See Comments)    Unk  . Penicillins Other (See Comments)    Unk  . Prednisone Other (See Comments)    Unk  . Sulfa Antibiotics Other (See Comments)    Unk   Scheduled Meds: . morphine CONCENTRATE  5 mg Oral Q6H   Continuous Infusions:  PRN Meds:.acetaminophen **OR** [DISCONTINUED] acetaminophen, bisacodyl, LORazepam, morphine CONCENTRATE  BP 147/93 mmHg  Pulse 68  Temp(Src) 98.2 F (36.8 C) (Oral)  Resp 18  Ht 5\' 2"  (1.575 m)  Wt 52.8 kg (116 lb 6.5 oz)  BMI 21.28 kg/m2  SpO2 96%  LMP  (LMP Unknown)   PPS:20 %    Intake/Output Summary (Last 24 hours) at 04/16/14 0910 Last data filed at 04/16/14 0531  Gross per 24 hour  Intake      0 ml  Output    700 ml  Net   -700 ml       Physical Exam:  General: awake and alert, NAD HEENT:  Dry buccal membranes, no exudate Chest:   decreased in bases CVS: irregualr Abdomen: soft NT +BS  Labs: CBC    Component Value Date/Time   WBC 5.6 04/15/2014 0530   RBC 3.65* 04/15/2014 0530   HGB 9.7* 04/15/2014 0530   HCT 31.5* 04/15/2014 0530   PLT 171 04/15/2014 0530   MCV 86.3 04/15/2014 0530   MCH 26.6 04/15/2014 0530   MCHC 30.8 04/15/2014 0530   RDW 15.7* 04/15/2014 0530   LYMPHSABS 0.9 04/11/2014 0400   MONOABS 0.8 04/11/2014 0400   EOSABS 0.0 04/11/2014 0400   BASOSABS  0.0 04/11/2014 0400    BMET    Component Value Date/Time   NA 154* 04/15/2014 0530   K 2.6* 04/15/2014 0530   CL 115* 04/15/2014 0530   CO2 28 04/15/2014 0530   GLUCOSE 121* 04/15/2014 0530   BUN 26* 04/15/2014 0530   CREATININE 0.77 04/15/2014 0530   CALCIUM 9.4 04/15/2014 0530   GFRNONAA 74* 04/15/2014 0530   GFRAA 86* 04/15/2014 0530    CMP     Component Value Date/Time   NA 154* 04/15/2014 0530   K 2.6* 04/15/2014 0530   CL 115* 04/15/2014 0530   CO2 28 04/15/2014 0530   GLUCOSE 121* 04/15/2014 0530   BUN 26* 04/15/2014 0530   CREATININE 0.77 04/15/2014 0530   CALCIUM 9.4 04/15/2014 0530   PROT 5.6* 04/11/2014 0400   ALBUMIN 2.9* 04/11/2014 0400   AST 32 04/11/2014 0400   ALT 24 04/11/2014 0400   ALKPHOS 62 04/11/2014 0400   BILITOT 0.6 04/11/2014 0400   GFRNONAA 74* 04/15/2014 0530   GFRAA 86* 04/15/2014 0530     Patient Documents Completed or Given: Document Given Completed  Advanced Directives Pkt    MOST    DNR    Gone from  My Sight    Hard Choices X     Time In Time Out Total Time Spent with Patient Total Overall Time  0815 0850 35 min 35 min    Greater than 50%  of this time was spent counseling and coordinating care related to the above assessment and plan.  Lorinda Creed NP  Palliative Medicine Team Team Phone # [phone] Pager [phone]   1
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
## 2014-04-30 DEATH — deceased

## 2014-05-18 NOTE — H&P (Signed)
PATIENT NAME:  [name], [name] MR#:  [id number] DATE OF BIRTH:  [birth year]  DATE OF ADMISSION:  01/26/2012  PRIMARY CARE PHYSICIAN:  Alonna Buckler, MD  PRIMARY CARDIOLOGIST:  Arnoldo Hooker, MD  REFERRING PHYSICIAN:  Janalyn Harder, MD.  CHIEF COMPLAINT: Shortness of breath.   HISTORY OF PRESENT ILLNESS:  The patient is an 81 year old female with significant past medical history of severe aortic stenosis, coronary artery disease, systemic hypertension and paroxysmal A. fib. The patient presents today with complaints of gradual increase in shortness of breath. The patient reports her symptoms have been going on for the last few weeks but reports it did get much worse over the last 2 days and reports it is mainly exertional, as well complains of some wheezing. The patient reports he did have some chest pain at home, which was mild and resolved with her taking some sublingual nitro.  In the ED, the patient was found to be hypoxic upon exertion.  Her chest x-ray did show evidence of volume overload and vascular congestion, and she had elevated pro BNP more than 4000. The patient was given 40 of IV Lasix where she reports her symptoms much improved after that. As well, the patient was given a total of 324 of aspirin as well.   The patient's last echo done was last year which did show her having an ejection fraction of 40% to 45%.  She did not have repeat echo since then.  PAST MEDICAL HISTORY: 1.  History of severe aortic stenosis, not a surgical candidate.  2.  History of coronary artery disease  3.  Questionable history of pulmonary fibrosis.  4.  History of hypertension.  5.  History of paroxysmal atrial fibrillation.  6.  History of osteoarthritis and osteoporosis.  7.  History of spinal scoliosis and spinal stenosis.  8.  History of chronic recurrent UTI. 9.  History of hyperlipidemia.   PAST SURGICAL HISTORY: 1.  Appendectomy.  2.  Hysterectomy.  3.  Varicose veins surgery. 4.   Hemorrhoidectomy.  5.  Fibroid removal.  6.  Cardiac stent implant in 2007.   SOCIAL HISTORY: The patient is retired from working in [employer]. 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  Nonsmoker currently, remote history of smoking, quit more than 30 years ago. No history of alcohol abuse.   FAMILY HISTORY: Significant for lung cancer and breast cancer in her mother.  Also significant for coronary artery disease in her sisters.   REVIEW OF SYSTEMS:  The patient denies fever, chills. Complains of fatigue and weakness.  EYES: Denies blurry vision, double vision or pain.  ENT: Denies tinnitus, ear pain, hearing loss, epistaxis.  RESPIRATORY: Complains of shortness of breath. She denies hemoptysis, cough, productive sputum or COPD.  CARDIOVASCULAR: Had mild chest pain, resolved. Denies any orthopnea, arrhythmia, syncope, palpitation.  GASTROINTESTINAL: Denies nausea, vomiting, diarrhea, abdominal pain, hematemesis.  GENITOURINARY: Denies dysuria, hematuria, renal colic.  ENDOCRINE:  Denies polyuria, polydipsia, heat or cold intolerance.  HEMATOLOGY: Denies anemia, easy bruising.  INTEGUMENTARY: Denies acne, rash or skin lesion.  MUSCULOSKELETAL: Complains of arthritis, with generalized bone pain and limited activity. Denies any gout or cramps.  NEUROLOGIC: Denies CVA, TIA, seizures or focal deficits.  PSYCHIATRIC: Denies anxiety, schizophrenia, nervousness, insomnia.   PHYSICAL EXAMINATION: VITAL SIGNS: Temperature 97.8, pulse 68, respiratory rate 24, blood pressure 157/77, saturating 97% on room air.  GENERAL: Frail, elderly female who is comfortable in bed in no apparent distress.  HEENT: Head atraumatic,  normocephalic. Pupils to light. Pink conjunctivae. Anicteric sclerae. Moist oral mucosa.  NECK: Supple. No thyromegaly. No JVD.  CHEST: The patient had good air entry bilaterally with mild scattered wheezing. No rales or rhonchi, has significant chest deformity from  scoliosis.  ABDOMEN: Soft, nontender, nondistended. Bowel sounds present.  SKIN: Normal skin turgor. Warm and dry. No ulcers.  EXTREMITIES: No clubbing. No cyanosis. +1 edema.  NEUROLOGIC: Cranial nerves grossly intact. No focal motor deficits.  PSYCHIATRIC: Appropriate, awake, alert x 3.  MUSCULOSKELETAL: No joint swelling, or clubbing.   LABORATORY AND DIAGNOSTIC DATA:  Chest x-ray showing mild cardiomegaly with pulmonary vascular congestion.  EKG showing a ventricular rate at 69 with normal sinus rhythm. Pertinent labs glucose 123, pro-BNP 4170, BUN 18, creatinine 0.72, sodium 141, potassium 3.4, chloride 105, CO2 27, troponin 0.11.  White blood cells 6.7, hemoglobin 11.7, hematocrit 36.5, platelets 222.   ASSESSMENT AND PLAN: This is an 81 year old single female who presents with complaint of worsening shortness of breath, mainly exertional, worsening lower extremity edema, known history of systolic congestive heart failure and severe aortic stenosis.  1.  Shortness of breath. This is secondary to volume overload and acute congestive heart failure with evidence of early pulmonary edema and to baseline severe aortic stenosis.  2.  Chest pain. The patient does not have history of coronary artery disease.  Has received 324 of aspirin in the ED.  The patient has baseline elevated troponins appear to be around her baseline. Will continue to cycle troponins to follow the trend. The patient is already on optimal medication aspirin, statin, beta blocker, and losartan.  3.  History of paroxysmal atrial fibrillation. The patient is currently in normal sinus rhythm but anticoagulation not a candidate secondary to her risk for falls and recent history of bleed.  4.  Acute systolic congestive heart failure.  Last echocardiogram was done in December 2012, which showed an ejection fraction of 45%. Will repeat echo. Continue to cycle troponins. Have on strict ins and outs, fluid restriction and daily weights.  Will consult cardiology service. The patient will be on intravenous Lasix 40 every 12 hours and we will monitor electrolytes closely and replace.  5.  History of hyperlipidemia.  Continue with statin.  6.  Hypertension.  Continue home medication.  7.  Deep vein thrombosis prophylaxis. Subcutaneous heparin.  8.  Gastrointestinal.   Proton pump inhibitor.  CODE STATUS: The patient reports she is DO NOT RESUSCITATE.  Total time spent on admission and patient care: 55 minutes.    ____________________________ Starleen Armsawood S. Elgergawy, MD dse:ct D: 01/26/2012 06:07:56 ET T: 01/27/2012 11:26:24 ET JOB#: [id number]  cc: Starleen Armsawood S. Elgergawy, MD, <Dictator> DAWOOD Teena IraniS ELGERGAWY MD ELECTRONICALLY SIGNED 01/29/2012 7:21

## 2014-05-21 NOTE — Consult Note (Signed)
PATIENT NAME:  [name], [name] MR#:  [id number] DATE OF BIRTH:  November 08, 1926  DATE OF CONSULTATION:  10/26/2012  REFERRING PHYSICIAN:  Rolly PancakeVivek West. Cherlynn KaiserSainani, MD CONSULTING PHYSICIAN:  Lamar BlinksBruce West. Zanobia Griebel, MD  REASON FOR CONSULTATION: Acute on chronic systolic dysfunction, congestive heart failure, elevated troponin, valve heart disease with aortic valve stenosis with atrial fibrillation.   CHIEF COMPLAINT: "I got short of breath."   HISTORY OF PRESENT ILLNESS: This is an 81 year old female with known aortic valve stenosis, status post transarterial valvular replacement several weeks prior. The patient did quite well but does have some moderate LV systolic dysfunction, mitral and tricuspid regurgitation by previous echocardiogram. The patient also has had some chronic atrial fibrillation over the last many months, most consistent with the above issues. She has had also some peripheral vascular disease with right iliac stenosis and came in with severe shortness of breath, pulmonary edema, hypoxia, weakness and fatigue. The patient, therefore, has acute on chronic systolic dysfunction, congestive heart failure with an echocardiogram showing moderate global LV systolic dysfunction with ejection fraction of 30% with a BNP of 10,574. The patient's EKG has shown atrial fibrillation with nonspecific ST and T wave changes with good heart rate control. She has been on Plavix for her recent valvular replacement and doing well with no evidence of significant anemia. She has received intravenous Lasix with significant improvements of hypoxia and fluid retention. She does have an elevated troponin of 0.9, most consistent with hypoxia and demand ischemia.   REVIEW OF SYSTEMS: The remainder review of systems negative for vision change, ringing in the ears, hearing loss, cough, congestion, heartburn, nausea, vomiting, diarrhea, bloody stools, stomach pain, extremity pain, leg weakness, cramping of the buttocks, known blood  clots, headaches, blackouts, dizzy spells, nosebleeds, congestion, trouble swallowing, frequent urination, urination at night, muscle weakness, numbness, anxiety, depression, skin lesions or skin rashes.   PAST MEDICAL HISTORY 1.  LV systolic dysfunction.  2.  Aortic stenosis.  3.  Atrial fibrillation.  4.  Peripheral vascular disease.   FAMILY HISTORY: No family members with early onset of cardiovascular disease and hypertension.   SOCIAL HISTORY: The patient currently denies alcohol or tobacco use.   ALLERGIES: As listed.   MEDICATIONS: As listed.   PHYSICAL EXAMINATION VITAL SIGNS: Blood pressure 122/68 bilaterally, heart rate is 70 upright, reclining and irregular.  GENERAL: She is a well-appearing female in no acute distress.  HEAD, EYES, EARS, NOSE AND THROAT: No icterus, thyromegaly, ulcers, hemorrhage or xanthelasma.  CARDIOVASCULAR: Irregularly irregular with normal S1, soft S2 with a 1 to 2 out of 6 right upper sternal border murmur nonradiating. PMI is diffuse. Carotid upstroke normal without bruit. Jugular venous pressure is normal.  LUNGS: Lungs have bibasilar crackles with decreased breath sounds.  ABDOMEN: Soft, nontender, without hepatosplenomegaly or masses. Abdominal aorta is normal size without bruit.  EXTREMITIES: Show 2+ radial, dorsal pedal, femoral arteries with no lower extremity edema, cyanosis, clubbing or ulcers.  NEUROLOGIC: She is oriented to time, place and person, with normal mood and affect.   ASSESSMENT: An 81 year old female with aortic valve replacement percutaneously, hypertension, hyperlipidemia, atrial fibrillation with acute on chronic systolic dysfunction, congestive heart failure and pulmonary edema.   RECOMMENDATIONS 1.  Continue intravenous Lasix for further risk reduction of congestive heart failure.  2.  Continue Plavix for further risk reduction in valvular stenosis and/or thrombosis and iliac stenosis and thrombosis.  3.  Continue atrial  fibrillation heart rate control with low-dose beta blocker.  4.  Consider  further antihypertensive and treatment options with ACE inhibitor if patient can tolerate with blood pressure.  5.  Ambulate and adjust medications for above.    ____________________________ Lamar Blinks, MD bjk:cs D: 10/26/2012 18:44:36 ET T: 10/26/2012 19:05:17 ET JOB#: [id number]  cc: Lamar Blinks, MD, <Dictator> Lamar Blinks MD ELECTRONICALLY SIGNED 10/28/2012 10:35

## 2014-05-21 NOTE — Consult Note (Signed)
CHIEF COMPLAINT and HISTORY:  Subjective/Chief Complaint SOB   History of Present Illness Patient admitted with SOB and has undergone cardiac work up.  Has severe AS and is to be evaluated by a cardiac surgeon for repair later this month. Had a cardiac cath yesterday that was difficult due to a porcelain aorta with tortuosity and significant PAD.  Her DP pulse was only dopplerable today.  She is not currently complaining of any foot or lower leg pain.  Has some pain from cath site and has had multiple caths done in the past.  Her feet are a little cool bilaterally, but pretty similar to each other. No skin changes or mottling.  No ulceration or gangrenous changes.   PAST MEDICAL/SURGICAL HISTORY:  Past Medical History:   Atrial Fibrillation:    MI - Myocardial Infarct:    Pulmonary Fibrosis: Jan 2013   Scoliosis:    Scleraderma:    arthritis:    bronchitis:    HTN:    heart disease:    Appendectomy:    Hemorrhoidectomy:    Hysterectomy - Partial:    fibroid tumors removed froom bilateral breats:    angioplasty with insertion of stent: 04-Nov-2007  ALLERGIES:  Allergies:  Penicillin: Swelling  Sulfur: Unknown  Prednisone: Unknown  Keflex: Unknown  HOME MEDICATIONS:  Home Medications: Medication Instructions Status  lisinopril 2.5 mg oral tablet 1 tab(s) orally once a day Active  Cardizem CD 240 mg/24 hours oral capsule, extended release 1 cap(s) orally once a day Active  potassium chloride 20 mEq oral tablet, extended release 1 tab(s) orally 2 times a day Active  aspirin 325 mg oral tablet 1 tab(s) orally once a day Active  multivitamin 1 tab(s) orally once a day Active  Probiotic Formula - oral capsule 1 cap(s) orally once a day Active  Ocuvite Extra Antioxidant Multiple Vitamins and Minerals oral tablet 1 tab(s) orally once a day Active  acetaminophen-traMADol 325 mg-37.5 mg oral tablet 1 tab(s) orally every 4 hours, As Needed - for Pain Active  metoprolol 25  mg oral tablet 1 tab(s) orally 2 times a day Active  furosemide 40 mg oral tablet 1 tab(s) orally 2 times a day Active   Family and Social History:  Family History Non-Contributory   Social History negative tobacco, negative ETOH   Place of Living Home   Review of Systems:  Fever/Chills No   Cough Yes   Sputum No   Abdominal Pain No   Diarrhea No   Constipation No   Nausea/Vomiting No   SOB/DOE Yes   Chest Pain Yes   Telemetry Reviewed Afib   Dysuria No   Tolerating PT Yes   Tolerating Diet Yes   Medications/Allergies Reviewed Medications/Allergies reviewed   Physical Exam:  GEN well developed, well nourished   HEENT pink conjunctivae, moist oral mucosa   NECK No masses  trachea midline   RESP normal resp effort  no use of accessory muscles   CARD irregular rate  murmur present   VASCULAR ACCESS none   ABD denies tenderness  no liver/spleen enlargement   GU no superpubic tenderness   LYMPH negative neck, negative axillae   EXTR negative cyanosis/clubbing, negative edema, right PT pulse 1+, left PT pulse 1+, right DP pulse dopplerable, left DP pulse 1-2+   SKIN normal to palpation, No ulcers, no ulcers or gangrenous changes to feet   NEURO cranial nerves intact, motor/sensory function intact   PSYCH alert, A+O to time, place, person   LABS:  Laboratory Results: Cardiac Catherization:    09-Jul-14 10:21, Cardiac Catheterization  Cardiac Catheterization   [facility]  [address] [place], [place] [address]  [phone]     Cardiovascular Catheterization Comprehensive Report     Patient: [name]  Study date: 08/06/2012  MR number: [id number]  Account number: [id number]     DOB: 1926-08-23  Age: 81 years  Gender: Female  Race: White  Height: 57.1 in  Weight: 131.8 lb     Diagnostic Cardiologist:  Miquel Dunn, PhD, MD     SUMMARY:     -CARDIAC STRUCTURES: Analysis of regional contractile  function  demonstrated moderate apical hypokinesis and moderate diaphragmatic  hypokinesis. EF calculated by contrast ventriculography was 37 %.  There was critical aortic stenosis.     -AORTA: There was moderately severe atheroma in the infrarenal  abdominal aorta.     -Summary: Insignificant CAD with patent stent proximal LAD.     CORONARY CIRCULATION: The coronary circulation is right dominant.  Proximal LAD: There was a stenosis at the site of a prior stent.  Proximal ramus intermedius: There was a tubular 50 % stenosis.  Proximal RCA: There was a 40 % stenosis. Distal RCA: There was a 20 %  stenosis.     AORTA: There was moderately severe atheroma in the infrarenal  abdominal aorta.     RIGHT LOWER EXTREMITY VESSELS: Mid right common iliac: There was a 60  % stenosis.     VENTRICLES: Analysis of regional contractile function demonstrated  moderate apical hypokinesis and moderate diaphragmatic hypokinesis.  EF calculated by contrast ventriculography was 37 %.     VALVES: AORTIC VALVE: There was critical aortic stenosis.  RECOMMENDATIONS: -AVR with possible Maze procedure.     INDICATIONS: Angina/MI: atypical chest pain.     CLINICAL PRESENTATION: NYHA class III congestive heart failure was  recently diagnosed.     HISTORY: No history of previous myocardial infarction. There was no  prior diagnosis of congestive heart failure. The patient has  hypertension. There was no history of cardiogenic shock,  cerebrovascular disease, peripheral arterial disease, chronic lung  disease, diabetes, dyslipidemia, cardiomyopathy, or cardiac arrest.  There was no family history of coronary artery disease. PRIOR  CARDIOVASCULAR PROCEDURES: No history of valve surgery, coronary or  graft percutaneous intervention, or coronary bypass surgery.     PRIOR DIAGNOSTIC TEST RESULTS: No prior stress test is available.     PROCEDURES PERFORMED: Procedure: Successful Closure with  Mynx  Procedure: Successful manual compression to RFV     PROCEDURE: The risks and alternatives of the procedures and conscious  sedation were explained to the patient and informed consent was  obtained. The patient was brought to the cath lab and placed on the  table. The planned puncture sites were prepped and draped in the  usual sterile fashion.     -Right femoral artery access. The vessel was accessed, awire was  threaded into the vessel, and a was advanced over the wire into the  vessel.     -Successful Closure with Mynx.     -Successful manual compression to RFV.     PROCEDURE COMPLETION: TIMING: Test started at 10:30. Test concluded at  11:51. RADIATION EXPOSURE: Fluoroscopy time: 35.23 min. Fluoroscopy  dose: 2.933 Gray.  MEDICATIONS GIVEN: Midazolam, 0.5 mg, IV, at 10:28. Fentanyl, 25 mcg,  IV, at 10:28. Midazolam, 0.5 mg, IV, at 10:31. Fentanyl, 25 mcg, IV,  at 10:31. Fentanyl, 50 mcg, IV,at 11:43.  CONTRAST  GIVEN: Isovue 170 ml.     Prepared and signed by     Miquel Dunn, PhD, MD  Signed 08/06/2012 13:36:14     STUDY DIAGRAM     Angiographic findings  Native coronary lesions:   Proximal RI: Lesion 1: tubular, 50 % stenosis.   Proximal RCA: Lesion 1: 40 % stenosis.   Distal RCA: Lesion 1: 20 % stenosis.     HEMODYNAMIC TABLES     Pressures:  Baseline  Pressures:  - HR: 110  Pressures:  - Rhythm:  Pressures:  -- Aortic Pressure (S/D/M): 127/75/82  Pressures:  -- Arterial (S/D/M): 113/79/93  Pressures:  -- Left Ventricle (s/edp): 149/12/--  Pressures:  -- Pulmonary Artery (S/D/M): 55/37/45  Pressures:  -- Pulmonary Capillary Wedge: 34/36/31  Pressures:  -- Right Atrium (a/v/M): 16/18/15  Pressures:  -- Right Ventricle (s/edp): 58/15/--  Pressures:  -- Valve data (splice) - ART: 169/45/--  Pressures:  -- Valve data (splice) - LV: 038/8/--     O2 Sats:  Baseline  O2 Sats:  - HR: 110  O2 Sats:  - Rhythm:  O2 Sats:  -- AO: 12/96/15.67  O2 Sats:  -- PA:  12/60/9.79     Valves:Baseline  Valves:  -- GENERIC: Left systolic ejection period (splice): 82.80  Valves:  -- GENERIC: Valve area (splice): 0.34  Valves:  -- GENERIC: Valve data (splice) - Gradient: 91.79  Valves:  -- GENERIC: Valve flow (splice): 150.56  Valves:  -- GENERIC: Valve index: 0.35     Outputs:  Baseline  Outputs:  -- CALCULATIONS[age]  Outputs:  -- CALCULATIONS: Body Surface Area: 1.51  Outputs:  -- CALCULATIONS: Height in cm: 145.00  Outputs:  -- CALCULATIONS: Sex: Female  Outputs:  -- CALCULATIONS: Weight in kg: 59.90  Outputs:  -- OUTPUTS: CO by Fick: 2.70  Outputs:  -- OUTPUTS: Fick cardiac index: 1.79  Outputs:  -- OUTPUTS: Fick HR: 112.00  Outputs:  -- OUTPUTS: O2 consumption: 158.44  Outputs:  -- OUTPUTS: Vo2 Indexed: 104.98  Outputs:  -- RESISTANCES: Left ventricular stroke work: 18.89  Outputs:  -- RESISTANCES: Left Ventricular Stroke Work index: 12.52  Outputs:  -- RESISTANCES: Pulmonary vascular index (dsc): 626.67  Outputs:  -- RESISTANCES: Pulmonary vascular index (Wood Units): 7.84  Outputs:  -- RESISTANCES: Pulmonary vascular resistance (dsc): 415.22  Outputs:  -- RESISTANCES: Pulmonary vascular resistance (Wood Units):  5.19  Outputs:  -- RESISTANCES: PVR_SVR Ratio: 0.21  Outputs:  -- RESISTANCES: Right ventricular stroke work: 10.00  Outputs:  -- RESISTANCES: Right ventricular stroke work index: 6.63  Outputs:  -- RESISTANCES: Systemic vascular index (dsc): 2999.07  Outputs:  -- RESISTANCES: Systemic vascular index (Wood Units): 37.50  Outputs:  -- RESISTANCES: Systemic vascular resistance (dsc): 1987.11  Outputs:  -- RESISTANCES: Systemic vascular resistance (Wood Units):  24.85  Outputs:  -- RESISTANCES: Total pulmonary index (dsc): 2014.30  Outputs:  -- RESISTANCES: Total pulmonary index (Wood Units): 25.19  Outputs:  -- RESISTANCES: Total pulmonary resistance (dsc): 1334.63  Outputs:  -- RESISTANCES: Total pulmonary resistance  (Wood Units):  16.69  Outputs:  -- RESISTANCES: Total vascular index (Wood Units): 45.89  Outputs:  -- RESISTANCES: Total vascular resistance (dsc): 2431.98  Outputs:  -- RESISTANCES: Total vascular resistance (Wood Units):  30.41  Outputs:  -- RESISTANCES: Total vascular resistance index (dsc):  3670.50  Outputs:  -- RESISTANCES: TPR_TVR Ratio: 0.55  Outputs:  -- SHUNTS: Pulmonary flow: 2.70  Outputs:  -- SHUNTS: Qp Indexed: 1.79  Outputs:  --  SHUNTS: Qs Indexed: 1.79  Outputs:  -- SHUNTS: Systemic flow: 2.70  Cardiology:    08-Jul-14 10:52, ECG  Ventricular Rate 118  Atrial Rate 138  QRS Duration 110  QT 344  QTc 482  R Axis -14  T Axis 172  ECG interpretation   Atrial fibrillation with rapid ventricular response  Minimal voltage criteria for LVH, may be normal variant  Septal infarct (cited on or before 02-Jan-2011)  ST & T wave abnormality, consider lateral ischemia or digitalis effect  Abnormal ECG  When compared with ECG of 10-Jul-2012 04:53,  No significant change was found  ----------unconfirmed----------  Confirmed by OVERREAD, NOT ([id number]), editor PEARSON, BARBARA ([id number]) on 08/06/2012 12:40:59 PM  ECG   Routine Chem:    08-Jul-14 42:87, Basic Metabolic Panel (w/Total Calcium)  Glucose, Serum 102  BUN 16  Creatinine (comp) 0.81  Sodium, Serum 140  Potassium, Serum 3.9  Chloride, Serum 104  CO2, Serum 29  Calcium (Total), Serum 9.5  Anion Gap 7  Osmolality (calc) 281  eGFR (African American) >60  eGFR (Non-African American) >60  eGFR values <45m/min/1.73 m2 may be an indication of chronic  kidney disease (CKD).  Calculated eGFR is useful in patients with stable renal function.  The eGFR calculation will not be reliable in acutely ill patients  when serum creatinine is changing rapidly. It is not useful in   patients on dialysis. The eGFR calculation may not be applicable  to patients at the low and high extremes of body sizes, pregnant  women, and vegetarians.     08-Jul-14 10:59, Troponin I  Result Comment   TROPONIN - RESULTS VERIFIED BY REPEAT TESTING.   - C/TO TRAVENA BERKLEY 1150 08/05/12..Marland KitchenDF    - READ-BACK PROCESS PERFORMED.   Result(s) reported on 05 Aug 2012 at 11:52AM.    08-Jul-14 20:00, Troponin I  Result Comment   TROPONIN - RESULTS VERIFIED BY REPEAT TESTING.   - PREVIOUSLY NOTIFIED AT 11500 08/05/12 BY   - CDF   - VFM   Result(s) reported on 05 Aug 2012 at 09:10PM.    09-Jul-14 02:41, Troponin I  Result Comment   TROPONIN - RESULTS VERIFIED BY REPEAT TESTING.   -Durward ParcelCALLED @ 1150 08-05-12 BY CDF   - 08-06-12 [facility]   Result(s) reported on 06 Aug 2012 at 03:38AM.    09-Jul-14 13:59, Troponin I  Result Comment   TROPONINR - RESULTS VERIFIED BY REPEAT TESTING.   - RESULT CALLED TO AND READ BACK FROM   - MARIA THACKER/1453;08-06-12/SFW.   Result(s) reported on 06 Aug 2012 at 02:50PM.    10-Jul-14 068:11 Basic Metabolic Panel (w/Total Calcium)  Glucose, Serum 105  BUN 18  Creatinine (comp) 0.85  Sodium, Serum 139  Potassium, Serum 3.4  Chloride, Serum 104  CO2, Serum 31  Calcium (Total), Serum 9.3  Anion Gap 4  Osmolality (calc) 280  eGFR (African American) >60  eGFR (Non-African American) >60  eGFR values <625mmin/1.73 m2 may be an indication of chronic  kidney disease (CKD).  Calculated eGFR is useful in patients with stable renal function.  The eGFR calculation will not be reliable in acutely ill patients  when serum creatinine is changing rapidly. It is not useful in   patients on dialysis. The eGFR calculation may not be applicable  to patients at the low and high extremes of body sizes, pregnant  women, and vegetarians.  Cardiac:    08-Jul-14 10:59, Troponin I  Troponin I 0.08  0.00-0.05  0.05  ng/mL or less: NEGATIVE   Repeat testing in 3-6 hrs   if clinically indicated.  >0.05 ng/mL: POTENTIAL   MYOCARDIAL INJURY. Repeat   testing in 3-6 hrs if   clinically indicated.  NOTE: An increase or decrease   of  30% or more on serial   testing suggests a   clinically important change    08-Jul-14 20:00, Cardiac Panel  CK, Total 78  CPK-MB, Serum 0.8  Result(s) reported on 05 Aug 2012 at 08:49PM.    08-Jul-14 20:00, Troponin I  Troponin I 0.07  0.00-0.05  0.05 ng/mL or less: NEGATIVE   Repeat testing in 3-6 hrs   if clinically indicated.  >0.05 ng/mL: POTENTIAL   MYOCARDIAL INJURY. Repeat   testing in 3-6 hrs if   clinically indicated.  NOTE: An increase or decrease   of 30% or more on serial   testing suggests a   clinically important change    09-Jul-14 02:41, Cardiac Panel  CK, Total 81  CPK-MB, Serum 0.9  Result(s) reported on 06 Aug 2012 at 03:31AM.    09-Jul-14 02:41, Troponin I  Troponin I 0.08  0.00-0.05  0.05 ng/mL or less: NEGATIVE   Repeat testing in 3-6 hrs   if clinically indicated.  >0.05 ng/mL: POTENTIAL   MYOCARDIAL INJURY. Repeat   testing in 3-6 hrs if   clinically indicated.  NOTE: An increase or decrease   of 30% or more on serial   testing suggests a   clinically important change    09-Jul-14 13:59, Cardiac Panel  CK, Total 75  CPK-MB, Serum 0.8  Result(s) reported on 06 Aug 2012 at 02:43PM.    09-Jul-14 13:59, Troponin I  Troponin I 0.13  0.00-0.05  0.05 ng/mL or less: NEGATIVE   Repeat testing in 3-6 hrs   if clinically indicated.  >0.05 ng/mL: POTENTIAL   MYOCARDIAL INJURY. Repeat   testing in 3-6 hrs if   clinically indicated.  NOTE: An increase or decrease   of 30% or more on serial   testing suggests a   clinically important change  Routine Hem:    08-Jul-14 10:59, Hemogram, Platelet Count  WBC (CBC) 4.8  RBC (CBC) 4.26  Hemoglobin (CBC) 11.5  Hematocrit (CBC) 34.4  Platelet Count (CBC) 185  Result(s) reported on 05 Aug 2012 at 11:19AM.  MCV 81  MCH 27.1  MCHC 33.5  RDW 14.1   RADIOLOGY:  Radiology Results: XRay:    28-Dec-13 03:31, Chest Portable Single View  Chest Portable Single View  REASON FOR EXAM:    CP  COMMENTS:        PROCEDURE: DXR - DXR PORTABLE CHEST SINGLE VIEW  - Jan 26 2012  3:31AM     RESULT: Comparison made to prior study of 01/09/2011 and 03/10/2011.   Cardiomegaly with pulmonary vascular prominence and interstitial   prominence consistent with congestive heart failure.    IMPRESSION:  Congestive heart failure with interstitial edema.        Verified By: Osa Craver, M.D., MD    16-Feb-14 08:55, Chest PA and Lateral  Chest PA and Lateral  REASON FOR EXAM:    Shortness of Breath  COMMENTS:   May transport without cardiac monitor    PROCEDURE: DXR - DXR CHEST PA (OR AP) AND LATERAL  - Mar 16 2012  8:55AM     RESULT: Comparison is made to prior study dated 01/26/2012.    Findings: There is prominence of the interstitial markings which has  decreased in conspicuity when compared to the previous study. There   appears to be evidence of peribronchial cuffing. No focal regions of   consolidation are identified. There is flattening of the left   hemidiaphragm. The cardiac silhouette is enlarged. The aorta is tortuous   and ectatic. The visualized bony skeleton demonstrates no evidence of   acute abnormalities.  IMPRESSION:      1. Interstitial infiltrate a component of which likely represents   pulmonary edema as well as underlying component of pulmonary fibrosis. An   infectious or inflammatory infiltrate cannot be excluded.  2. COPD.  3. Surveillance evaluation recommended.    Thank you for the opportunity to contribute to the care of your patient.         Verified By: Mikki Santee, M.D., MD    12-Jun-14 05:10, Chest Portable Single View  Chest Portable Single View  REASON FOR EXAM:    Chest Pain  COMMENTS:       PROCEDURE: DXR - DXR PORTABLE CHEST SINGLE VIEW  - Jul 10 2012  5:10AM     RESULT: Comparison: 03/16/2012    Findings:  Cardiomegaly and the mediastinum are similar to prior given patient   positioning. Lobulation along the right superior mediastinum is  similar   to prior and likely related to the substernal extension of thyroid goiter   seen on the prior chest CT of 01/02/2011 there bilateral interstitial   opacities which are relatively similar to prior.    IMPRESSION:   Bilateral interstitial opacities are similar to prior. These could be   secondary to interstitial edema, although chronic interstitial lung   disease would be of differential consideration.    Dictation site: 2        Verified By: Gregor Hams, M.D., MD  LabUnknown:    28-Dec-13 03:31, Chest Portable Single View  PACS Image    16-Feb-14 08:55, Chest PA and Lateral  PACS Image    16-May-14 11:32, Screening Digital Mammogram  PACS Image    12-Jun-14 05:10, Chest Portable Single View  PACS Image  [facility]:    16-May-14 11:32, Screening Digital Mammogram  Screening Digital Mammogram  REASON FOR EXAM:    SCR MAMMO NO ORDER  COMMENTS:       PROCEDURE: MAM - MAM DGTL SCRN MAM NO ORDER W/CAD  - Jun 13 2012 11:32AM     RESULT: Comparison made to previous digital studies of Jun 06, 2011, Jun 05, 2010, and April 15, 2007.    The breastsexhibit a scattered fibroglandular pattern. Surgical scar   markers were placed bilaterally. There are benign-appearing coarse   macrocalcifications in both breasts. There is parenchymal density   associated with the deep aspect of the scar marker onthe right on the   MLO view which does not correspond to an area of abnormality on the cc   view. This area has waxed and waned in conspicuity in the past.  IMPRESSION:  There are no findings suspicious for malignancy.    BI-RADS 2: Benign findings.    Recommendation: please continue to encourage yearly mammographic followup.    BREAST COMPOSITION: The breast composition is SCATTERED FIBROGLANDULAR   TISSUE (glandular tissue is 25-50%)     A NEGATIVE MAMMOGRAM REPORT DOES NOT PRECLUDE BIOPSY OR OTHER EVALUATION   OF A CLINICALLY PALPABLE OR OTHERWISE SUSPICIOUS MASS OR  LESION. BREAST   CANCER MAY NOT BE DETECTED BY MAMMOGRAPHY IN UP TO 10% OF CASES.  Thank you for the opportunity to contribute to the care of your patient.  Dictation site:1        Verified By: DAVID A. Martinique, M.D., MD   ASSESSMENT AND PLAN:  Assessment/Admission Diagnosis PAD diminished pulses particularly in the right foot.   Plan She is not particularly symptomatic from her PAD.  Her AS and other issues likely limit her more.  If she was able to be more active, she might claudicate but she does not have any limb threatening symptoms of ischemia currently.  She does not have any pain and no signs of significant embolization to her feet, so I do not think there is an acute issue with her LE perfusion.  This likely represents chronic arterial changes.  No intervention or further work up is currently required.  I would be happy to see her in the office for ongoing follow up for her PAD after her cardiac issues are treated.  I can make an appointment for 2-3 months with ABIs.   level 4   Electronic Signatures: Algernon Huxley (MD)  (Signed 10-Jul-14 10:49)  Authored: Chief Complaint and History, PAST MEDICAL/SURGICAL HISTORY, ALLERGIES, HOME MEDICATIONS, Family and Social History, Review of Systems, Physical Exam, LABS, RADIOLOGY, Assessment and Plan   Last Updated: 10-Jul-14 10:49 by Algernon Huxley (MD)

## 2014-05-21 NOTE — Consult Note (Signed)
PATIENT NAME:  Alison GhaziFLORENCE, [name] MR#:  [id number] DATE OF BIRTH:  04-20-1926  DATE OF CONSULTATION:  08/05/2012  CONSULTING PHYSICIAN:  Marcina MillardAlexander Tomoya Ringwald, MD PRIMARY CARE PHYSICIAN: Alonna BucklerAndrew Lamb, MD  CARDIOLOGIST: Arnoldo HookerBruce Kowalski, MD   CHIEF COMPLAINT: Shortness of breath.   HISTORY OF PRESENT ILLNESS: The patient is an 81 year old female with known history of aortic stenosis and atrial fibrillation. The patient reports a recent history of persistent exertional dyspnea, generalized weakness. She was in her usual state of health until today when she noted chest pressure, presented to [facility] Emergency Room where she was noted to be in atrial fibrillation with a rapid rate of 130 bpm. The patient was treated with a dose of intravenous Cardizem with improvement in her heart rate and chest pain. The patient has borderline elevated troponin of 0.08.   PAST MEDICAL HISTORY: 1.  Severe aortic stenosis with mean gradient of 35 mmHg and a peak velocity of 3.71 m/sec by echocardiogram. 2.  Insignificant coronary artery disease by cardiac catheterization 03/11.  3.  Chronic paroxysmal atrial fibrillation.  4.  Hypertension.  5.  Hyperlipidemia.  6.  Bilateral carotid atherosclerosis.   MEDICATIONS: Aspirin 325 mg daily, Cardizem CD 240 mg daily, metoprolol tartrate 25 mg b.i.d., Lasix 40 mg b.i.d., multivitamin 1 daily, Ocuvite 1 daily, potassium 20 mEq daily, Probiotic 1 capsule daily.   SOCIAL HISTORY: The patient currently lives alone. She quit tobacco use 30 years ago.   FAMILY HISTORY: Positive for coronary artery disease, father died status post myocardial infarction.   REVIEW OF SYSTEMS:   CONSTITUTIONAL: No fever or chills.  EYES: No blurry vision.  EARS: No hearing loss.  RESPIRATORY: The patient does have exertional dyspnea.  CARDIOVASCULAR: The patient does have intermittent chest tightness due to atrial fibrillation.  GASTROINTESTINAL: No nausea, vomiting, diarrhea, constipation.   GENITOURINARY: No dysuria or hematuria.  ENDOCRINE: No polyuria or polydipsia.  MUSCULOSKELETAL: No arthralgias or myalgias.  NEUROLOGICAL: No focal muscle weakness, numbness.  PSYCHOLOGICAL: No depression or anxiety.   PHYSICAL EXAMINATION: HEENT: Pupils equal, reactive to light and accommodation.  NECK: Supple without thyromegaly.  LUNGS: Clear.  HEART: Normal JVP. Normal PMI. Regular rate and rhythm. Normal S1, S2. Grade 3/6 crescendo/decrescendo murmur.  ABDOMEN: Soft and nontender. Pulses were intact bilaterally.  MUSCULOSKELETAL: Normal muscle tone.  NEUROLOGIC: The patient is alert and oriented x 3. Motor and sensory are both grossly intact.   IMPRESSION: An 81 year old female with known history of severe aortic stenosis and significant coronary artery disease with paroxysmal atrial fibrillation who presents with atrial fibrillation with a rapid ventricular rate, stabilized after intravenous dose of Cardizem. The patient has borderline elevated troponin but currently is chest pain free.   RECOMMENDATIONS: 1.  Agree with overall current therapy.  2.  Would defer full-dose anticoagulation.  3.  Proceed with right and left heart cardiac catheterization in the a.m. to further delineate degree of aortic stenosis, rule out significant coronary artery disease prior to aortic valve replacement versus TABR (transcatheter aortic valve replacement).        The risks, benefits and alternatives of cardiac catheterization were explained to the patient, and informed written consent was obtained. ____________________________ Marcina MillardAlexander Garlin Batdorf, MD ap:cb D: 08/05/2012 19:25:57 ET T: 08/05/2012 20:09:15 ET JOB#: [id number]  cc: Marcina MillardAlexander Sameera Betton, MD, <Dictator> Marcina MillardALEXANDER Kairi Tufo MD ELECTRONICALLY SIGNED 08/19/2012 12:29

## 2014-05-21 NOTE — Discharge Summary (Signed)
PATIENT NAME:  Alison GhaziFLORENCE, [name] MR#:  [id number] DATE OF BIRTH:  October 24, 1926  DATE OF ADMISSION:  09/06/2012 DATE OF DISCHARGE:  09/08/2012  PRIMARY CARE PHYSICIAN: Alonna BucklerAndrew Lamb, MD  CARDIOLOGIST: Arnoldo HookerBruce Kowalski, MD  DISPOSITION: The patient is on transfer [facility] bed available.   CHIEF COMPLAINT: Chest tightness and shortness of breath.   FINAL DIAGNOSES: 1.  Shortness of breath secondary to severe aortic stenosis and mild congestive heart failure, systolic.  2.  Acute on chronic congestive heart failure, systolic. Ejection fraction 37%.   3.  Elevated troponin, likely demand ischemia in the setting of rapid atrial fibrillation and aortic stenosis.  4.  Chronic systolic congestive heart failure. The patient's heart failure has resolved.  5.  Hypertension, stable.  6.  Severe aortic stenosis. The [facility] is [facility] on aortic valve replacement.   CODE STATUS: NO CODE, DNR.   CURRENT MEDICATIONS:  1.  Aspirin 325 mg p.o. daily.  2.  Diltiazem CD 240 mg p.o. daily.  3.  Lovenox 40 mg sub-Q daily.  4.  Neurontin 100 mg b.i.d.  5.  Zofran 4 mg IV push q. 4 hours p.r.n.  6.  K-Dur 20 mEq b.i.d.  7.  Tramadol 50 mg q. 4 to 6 hours p.r.n.  8.  Maalox 30 mL q. 6 hours p.r.n.  9.  Lasix 40 mg b.i.d.  10.  Culturelle 1 capsule daily.  11.  Metoprolol 25 mg b.i.d.  12.  Multivitamin p.o. daily.   LABORATORY AND DIAGNOSTICS:  BNP 13,105. Cardiac enzymes:  Troponin 0.42 and 0.09. Comprehensive metabolic profile within normal limits. EKG shows A-fib with RVR. Chest x-ray consistent with chronic interstitial prominence. White count 4.7, H and H 11.3 and 34.1.  BRIEF SUMMARY OF HOSPITAL COURSE:  [name] is a very pleasant 81 year old Caucasian female with history of paroxysmal A-fib and history of severe aortic stenosis with aortic valve area of 0.54, history of hypertension and chronic systolic congestive heart failure who comes in with  increasing shortness of breath, weakness and chest pressure. She was admitted with:  1.  Chest pressure and shortness of breath, which was suspected due to her underlying tight severe aortic stenosis. The patient was in rapid A-fib.  It resolved and improved after she was diuresed with IV Lasix. She is changed now to p.o. Lasix b.i.d. She is status post cardiac cath in the past with EF of 37% per cath and critical aortic stenosis. Dr. Gwen PoundsKowalski saw the patient and is making arrangements for [facility] to be [facility].  2.  Elevated troponin.  Likely demand ischemia in the setting of rapid A-fib, underlying severe aortic stenosis. The patient did not have any chest pain. Aspirin and beta blockers were continued.  3.  Paroxysmal A-fib. Heart rate is in the 90s to 100s.  Continued Cardizem and metoprolol for rate control and aspirin.  4.  Hypertension. Stable with metoprolol and Cardizem.   Hospital stay otherwise remained stable. The patient is a NO CODE, DNR.  TIME SPENT: 40 minutes. ____________________________ Wylie HailSona A. Allena KatzPatel, MD sap:sb D: 09/08/2012 09:24:08 ET T: 09/08/2012 10:38:36 ET JOB#: [id number]  cc: Reeya Bound A. Allena KatzPatel, MD, <Dictator> Willow OraSONA A Brylee Berk MD ELECTRONICALLY SIGNED 09/19/2012 5:40

## 2014-05-21 NOTE — Consult Note (Signed)
PATIENT NAME:  Alison GhaziFLORENCE, Pamila J MR#:  [id number] DATE OF BIRTH:  05-Sep-1926  DATE OF CONSULTATION:  09/06/2012  CONSULTING PHYSICIAN:  Lamar BlinksBruce J. Gleen Ripberger, MD  REQUESTING PHYSICIAN:  Dr. Allena KatzPatel  REASON FOR CONSULTATION:  Atrial fibrillation, aortic valve stenosis, with congestive heart failure.   CHIEF COMPLAINT: "I'm short of breath again."   HISTORY OF PRESENT ILLNESS:  An 81 year old female with new onset severe shortness of breath with any physical activity, with lower extremity edema, atrial fibrillation with rapid ventricular rate, and acute on chronic congestive heart failure with valvular heart disease. The patient has had severe aortic valve stenosis, atrial fibrillation with controlled ventricular rate on beta blocker and aspirin, with no anticoagulation due to patient's concerns of bleeding complications. She has had preparation for possible aortic valve replacement, although the patient is waiting for this issue. The patient now has had new onset of lower extremity edema, pulmonary edema, hypoxia consistent with acute on chronic systolic dysfunction congestive heart failure. She did receive intravenous Lasix and continued other medications, with improvement overnight. She has had improvement of her hypoxia.   The remainder of review of systems negative for vision change, ringing in the ears, hearing loss, cough, congestion, heartburn, nausea, vomiting, diarrhea, bloody stools, stomach pain, extremity pain, leg weakness, cramping of the buttocks, known blood clots, headaches, blackouts, dizzy spells, nosebleed, congestion, trouble swallowing, frequent urination, urination at night, muscle weakness/numbness, anxiety, depression, skin lesions, skin rashes.   PAST MEDICAL HISTORY: 1.  Aortic stenosis.  2.  LV dysfunction.  3.  Atrial fibrillation.  4.  Hypertension.   FAMILY HISTORY: No family members with early onset of cardiovascular disease or hypertension.   SOCIAL HISTORY:  Currently  denies alcohol or tobacco use.   ALLERGIES:  As listed.   MEDICATIONS:  As listed.   PHYSICAL EXAMINATION: VITAL SIGNS:  Blood pressure is 122/68 bilaterally, heart rate 72 upright, reclining and regular.  GENERAL:  She is a well-appearing female in no acute distress.  HEAD, EYES, EARS, NOSE, THROAT: No icterus, thyromegaly, ulcers, hemorrhage, or xanthelasma.  CARDIOVASCULAR:  Irregularly irregular with normal S1, soft S2, with a 3/6 right upper sternal border murmur radiating throughout. PMI is diffuse. Carotid upstroke normal, with murmur radiation. Jugular venous pressure is normal.  LUNGS:  Have bibasilar crackles, decreased in the bases.  ABDOMEN:  Soft, nontender, without apparent hepatosplenomegaly or masses. Abdominal aorta is normal size, without bruit.  EXTREMITIES:  Show 2+ radial, femoral, dorsal pedal pulses, with 1+ lower extremity edema. No cyanosis, clubbing, or ulcers.  NEUROLOGIC:  She is oriented to time, place and person, with normal mood and affect.   ASSESSMENT:  An 81 year old female with acute on chronic systolic dysfunction congestive heart failure, severe aortic stenosis, atrial fibrillation, hypertension, hyperlipidemia, having improvement with Lasix.   RECOMMENDATIONS: 1.  Continue intravenous Lasix for pulmonary edema and lower extremity edema.  2.  Continue beta blocker for heart rate control of atrial fibrillation. 3.  No anticoagulation due to patient's wishes, although knows and understands risks of stroke with atrial fibrillation.  4.  No further cardiac diagnostics necessary due to known minimal coronary artery disease by cardiac catheterization, and severe aortic stenosis. 5.  Transfer for aortic valve replacement.    ____________________________ Lamar BlinksBruce J. Manasa Spease, MD bjk:mr D: 09/06/2012 19:13:29 ET T: 09/06/2012 20:07:46 ET JOB#: [id number]  cc: Lamar BlinksBruce J. Niyonna Betsill, MD, <Dictator> Lamar BlinksBRUCE J Ova Meegan MD ELECTRONICALLY SIGNED 09/08/2012 7:30

## 2014-05-21 NOTE — H&P (Signed)
PATIENT NAME:  [name], [name] MR#:  [id number] DATE OF BIRTH:  1926/10/14  DATE OF ADMISSION:  10/25/2012  PRIMARY CARE PHYSICIAN: Dr. Alonna Buckler.   CHIEF COMPLAINT:  Shortness of breath.   HISTORY OF PRESENT ILLNESS:  This is an 81 year old female who presents to the hospital due to shortness of breath. The patient has a history of aortic valve stenosis, underwent an aortic valve replacement, a bovine valve, about a week ago at [facility]. She said she was feeling well. She was discharged only three days after her valve replacement and was feeling fine, although she has been feeling weak over the past few days on ambulation. At 4:00 this morning, she woke up, acutely feeling significantly short of breath. She decided to weigh herself and noticed that she had gained about 3-1/2 pounds since yesterday. She therefore came to the ER for further evaluation. The patient was noted to be in acute decompensated congestive heart failure, was given some IV Lasix. Post-Lasix the patient's clinical symptoms have improved. Hospitalist services were contacted for further treatment and evaluation. The patient denies any chest pain, admits to some nausea, no vomiting and no other associated symptoms presently.   REVIEW OF SYSTEMS:.  CONSTITUTIONAL:  no documented fever. No weight gain, no weight loss.  EYES:  No blurred or double vision.  ENT:  No tinnitus. No postnasal drip. No redness of the oropharynx.  RESPIRATORY:  No cough, no wheeze, no hemoptysis. Positive dyspnea.  CARDIOVASCULAR:  No chest pain, no orthopnea, no palpitations, no syncope.  GASTROINTESTINAL:  Positive nausea. No vomiting, diarrhea, no abdominal pain, no melena or hematochezia.  GENITOURINARY:  No dysuria or hematuria.  ENDOCRINE:  No polyuria, nocturia, heat or cold intolerance.  HEMATOLOGIC:  No anemia, no bruising, no bleeding.  INTEGUMENTARY:  No rashes. No lesions.  MUSCULOSKELETAL:  No arthritis, no swelling, no gout.  NEUROLOGIC:  No  numbness or tingling. No ataxia. No seizure-type activity.  PSYCHIATRIC:  No anxiety, no insomnia. No ADD.   PAST MEDICAL HISTORY:  Consistent with a history of congestive heart failure, a history of critical aortic valve stenosis status post bovine aortic valve replacement, hypertension, hyperlipidemia, hyperthyroidism.   ALLERGIES:  KEFLEX, PENICILLIN, PREDNISONE AND SULFA.   SOCIAL HISTORY:  Used to be a smoker, quit about 30+ years ago. No alcohol abuse. No illicit drug abuse. Lives at home by herself.   FAMILY HISTORY:  Both mother and father are deceased. Mother died from breast cancer. Father died from an MI.   CURRENT MEDICATIONS:  As follows:  Tylenol 650 as needed q.6 hours, aspirin 325 mg daily, Centrum multivitamin daily, Plavix 75 mg daily, Culturelle [employer] oral capsule daily, Digoxin 125 mcg daily, diltiazem 240 mg daily, Colace 100 mg b.i.d. as needed, Lasix 40 mg daily, lisinopril 2.5 mg daily, metoprolol tartrate 100 mg b.i.d., Ocuvite multivitamin daily, oxycodone 5 mg q.6 hours as needed, tramadol 50 q.6 hours as needed, potassium 20 mEq b.i.d., Pravachol 20 mg daily, Senokot 1 tab at bedtime as needed, Tapazole 10 mg daily.   PHYSICAL EXAMINATION:  Presently are as follows VITAL SIGNS are noted to be:  Temperature is 98.2, pulse 79, respirations 18, blood pressure 140/70, sats 95% on 2 L nasal cannula.  GENERAL:  She is a pleasant-appearing female in no apparent distress.  HEAD, EYES, NOSE, THROAT:  The patient is atraumatic, normocephalic. Extraocular muscles are intact. Pupils reactive to light. Sclerae anicteric. No conjunctival injection. No pharyngeal erythema.  NECK:  Supple. There is  no jugular venous distention. No bruits, no lymphadenopathy or thyromegaly.  HEART:  Regular rate and rhythm. No murmurs, no rubs, no clicks.  LUNGS:  Clear to auscultation bilaterally. No rales or rhonchi. No wheezes.  ABDOMEN:  Soft, flat, nontender, nondistended. Has good  bowel sounds. No hepatosplenomegaly appreciated.  EXTREMITIES:  No evidence of any cyanosis, clubbing, or peripheral edema. Has +2 pedal and radial pulses bilaterally.  NEUROLOGICAL:  The patient is alert, awake and oriented x 3 with no focal motor or sensory deficits appreciated bilaterally.  SKIN:  Moist and warm with no rashes appreciated.  LYMPHATIC:  There is no cervical or axillary lymphadenopathy.   LABORATORY DATA:  Serum glucose of 112, BUN 12, creatinine 0.6, sodium 134, potassium 4.3, chloride 104, bicarb 25. Troponin 0.82. White cell count 4.1, hemoglobin 10.5, hematocrit 31.3, platelet count of 154.   The patient did undergo a CT scan of the chest with contrast, which showed no evidence of acute pulmonary embolism, small bilateral pleural effusions, bibasilar subsegmental atelectasis. No pericardial effusion.   ASSESSMENT AND PLAN:  This is an 81 year old female with history of congestive heart failure, a history of aortic valve stenosis with aortic valve replacement, hypertension, hyperlipidemia, hypothyroidism, who presents to the hospital due to shortness of breath and likely in congestive heart failure.   1.  Acute congestive heart failure. This is likely acute on chronic systolic dysfunction. The patient's ejection fraction in July 2014 was 37%, but she had not had her aortic valve replacement at that time. She is now post-aortic valve replacement for a critical valve aortic valve stenosis. For now, I will continue diuresis with IV Lasix, follow ins and outs and daily weights. I will continue her beta blocker and continue ACE inhibitor.  2.  Aortic valve replacement. This is a bovine valve. The patient [facility]. I will get a 2-dimensional echocardiogram to further evaluate this. If needed, consider Cardiology.  3.  Hypertension. The patient is hemodynamically stable. Continue metoprolol, Cardizem and lisinopril.  4.  Hyperthyroidism.  Continue with Tapazole.  5.  Hyperlipidemia. Continue Pravachol.  6.  A history of coronary artery disease. The patient acutely has no chest pain. She has a mildly elevated troponin. I think this is secondary to demand ischemia from her hypoxemia from her congestive heart failure. For now, I will continue her aspirin, Plavix, beta blocker and statin. If her troponins trend up significantly, I will consider getting a Cardiology consult.   CODE STATUS:  THE PATIENT IS A FULL CODE.   TIME SPENT ON ADMISSION IS:  50 minutes.  ____________________________ Rolly PancakeVivek J. Cherlynn KaiserSainani, MD vjs:jm D: 10/25/2012 10:30:48 ET T: 10/25/2012 11:04:13 ET JOB#: [id number]  cc: Rolly PancakeVivek J. Cherlynn KaiserSainani, MD, <Dictator> Houston SirenVIVEK J Dayron Odland MD ELECTRONICALLY SIGNED 11/05/2012 19:13

## 2014-05-21 NOTE — Discharge Summary (Signed)
PATIENT NAME:  [name], [name] MR#:  [id number] DATE OF BIRTH:  1926/07/01  DATE OF ADMISSION:  08/05/2012 DATE OF DISCHARGE:  08/07/2012  For a detailed note, please take a look at the history and physical done on admission.   DIAGNOSES AT DISCHARGE:  Is as follows:  1.  Severe aortic stenosis.  2.  Atrial fibrillation.  3.  History of systolic congestive heart failure.  4.  Hypertension.   DISCHARGE DIET:  The patient was discharged home on a low sodium, low fat diet.   ACTIVITY:  As tolerated.   FOLLOW-UP:  With Dr. Fidela JuneauAndy Lamb in the next 1 to 2 weeks.  The patient also has a [facility] for possible evaluation of aortic valve replacement.   DISCHARGE MEDICATIONS:  Are as follows:  Potassium chloride 20 mEq twice daily, aspirin 325 mg daily, metoprolol tartrate 25 mg twice daily, Lasix 40 mg twice daily, Cardizem CD 240 mg daily, multivitamin daily, probiotic daily, Ocuvite 1 tab daily, Tylenol with tramadol 37.5/325 1 tab q. 4 hours as needed, lisinopril 2.5 mg daily.   CONSULTANTS DURING THE HOSPITAL COURSE:  Dr. Jamse MeadAlex Paraschos from cardiology.  Dr. Festus BarrenJason Dew from vascular surgery.   PERTINENT STUDIES DONE DURING THE HOSPITAL COURSE:  Are as follows:  A cardiac catheterization done on 08/06/2012 showing moderately reduced LV dysfunction and EF of 35%.  No significant coronary artery disease with a 50% proximal RCA stenosis and distal RCA 20% stenosis.   HOSPITAL COURSE:  This is an 81 year old female with medical problems as mentioned above, presented to the hospital with palpitations and shortness of breath and noted to be in rapid atrial fibrillation.  1.  Shortness of breath.  Most likely cause of the patient's shortness of breath was secondary to her rapid A-Fib combined with severe aortic stenosis, also mild CHF.  The patient was maintained on her home Lasix dose.  She received some pulse doses of IV Cardizem to control her heart rate which did improve.   She did have a mild troponin elevation, therefore underwent a cardiac catheterization which showed some minimal coronary disease, EF of 37%.  She clinically feels much better since admission as her heart rate control is improved.  She is therefore being discharged home with close follow-up with cardiology as an outpatient, also for evaluation for possible aortic valve replacement as far as severe aortic stenosis.  2.  Elevated troponin.  This was likely in the setting of demand ischemia from her rapid A-Fib and underlying aortic stenosis.  Her troponins did not trend up.  She had no further chest pain.  Her cardiac catheterization did not show any evidence of any significant coronary artery disease.  She will continue her aspirin and beta-blocker as stated.  3.  Paroxysmal A-Fib.  Her rates are better controlled now in the low 90s to low 100s.  She is asymptomatic with this as her shortness of breath and palpitations improved.  She did receive some pulse doses of IV Cardizem while in the hospital.  She will resume her oral Cardizem and metoprolol upon discharge and continue her full dose aspirin as stated.  4.  History of chronic systolic congestive heart failure.  The patient clinically did not have any worsening of her congestive heart failure while in the hospital.  She was maintained on her oral Lasix, her oral beta-blocker.  She was not on an ACE inhibitor which was started prior to discharge given her LV dysfunction.  5.  Aortic stenosis.  She has critical severe aortic valve stenosis.  She likely needs aortic valve replaced.  She is being referred to [facility] for further evaluation of that.   CODE STATUS:  The patient is a FULL CODE.   Time spent on discharge is 40 minutes.    ____________________________ Rolly Pancake. Cherlynn Kaiser, MD vjs:ea D: 08/07/2012 15:42:53 ET T: 08/07/2012 23:05:32 ET JOB#: [id number]  cc: Rolly Pancake. Cherlynn Kaiser, MD, <Dictator> [place] Siren MD ELECTRONICALLY SIGNED  08/11/2012 20:39

## 2014-05-21 NOTE — H&P (Signed)
PATIENT NAME:  [name], [name] MR#:  [id number] DATE OF BIRTH:  1926/11/02  DATE OF ADMISSION:  09/06/2012  PRIMARY CARE PHYSICIAN:  Dr. Alonna Buckler.   CHIEF COMPLAINT:  Chest pain, shortness of breath.   HISTORY OF PRESENT ILLNESS:  [name] is an 81 year old pleasant female with a history of ischemic cardiomyopathy with EF of 25% to 30%, critical aortic stenosis with a valve area of 0.54 cm by echocardiogram done in June 2014, recent multiple admissions for congestive heart failure, presented to the Emergency Department with complaints of worsening shortness of breath for the last 4 to 5 days.  Also, the patient states that gained about more than 4 pounds of weight in the last three days.  Has been feeling extremely fatigued, unable to walk.  Has been experiencing pressure in the left side of the jaw and the left side of the neck.  The patient was also noted to be in atrial fibrillation with a rapid ventricular rate.  Initial work-up in the Emergency Department, the patient's troponin was 0.49.  Currently denies having any chest pain.  Denies having any PND, orthopnea.  Denies having any cough.  Has poor appetite.   PAST MEDICAL HISTORY: 1.  Severe aortic stenosis with a valve area of 0.54 cm.  2.  Systolic congestive heart failure.  3.  Ischemic cardiomyopathy with EF of 25% to 30%.  4.  Coronary artery disease, status post stent placement.  5.  Atrial fibrillation.  6.  Hypertension.  7.  Osteoporosis.  8.  Scoliosis.   ALLERGIES:  KEFLEX, PENICILLIN, PREDNISONE AND SULFA.   PAST SURGICAL HISTORY: 1.  Tonsillectomy.  2.  Appendectomy.  3.  Hemorrhoidectomy.  4.  Hysterectomy.  5.  Fibroid tumors removed.  6.  Balloon angioplasty and stent placement.   HOME MEDICATIONS: 1.  Tramadol 50 mg every 6 to 8 hours as needed.  2.  Potassium chloride 20 mEq 2 times a day.  3.  Ocuvite 1 tablet once a day.  4.  Metoprolol 25 mg 2 times a day.  5.  Gabapentin 100 mg 2 times a day.  6.   Lasix 40 mg 2 times a day.  7.  Diltiazem 240 mg once a day.  8.  Multivitamin 1 tablet daily.  9.  Aspirin 325 mg daily.   SOCIAL HISTORY:  Former smoker, quit 30 years back.  Denies drinking alcohol or using illicit drugs.  Lives by herself.   FAMILY HISTORY:  Mother died from the complications of the breast cancer.  Father died from myocardial infarction.   REVIEW OF SYSTEMS: CONSTITUTIONAL:  Generalized weakness, fatigue.  EYES:  No change in vision.  EARS, NOSE, THROAT:  No change in hearing.  No sore throat or tinnitus.  RESPIRATORY:  Has no cough.  Has shortness of breath.  CARDIOVASCULAR:  Has been experiencing chest pain, left jaw and left neck pain as well as increased pedal edema, shortness of breath with exertion.  GASTROINTESTINAL:  Decreased appetite.  GENITOURINARY:  No dysuria or hematuria.  SKIN:  No rash or lesions.  HEMATOLOGIC:  No easy bruising or bleeding.  MUSCULOSKELETAL:  Chronic generalized body aches.  NEUROLOGIC:  No weakness or numbness in any part of the body.   PHYSICAL EXAMINATION: GENERAL:  This is a frail-looking elderly female lying down in the bed, not in distress, is able to speak full sentences.  VITAL SIGNS:  Temperature 98.1, pulse 106, blood pressure 139/93, respiratory rate of 20, oxygen saturations 99%  on 3 liters of oxygen.  HEENT:  Head normocephalic, atraumatic.  Eyes, no sclerae icterus.  Conjunctivae normal.  Pupils equal and react to light.  Extraocular movements are intact.  Mucous membranes, mild dryness.  No pharyngeal erythema.  NECK:  Supple.  No lymphadenopathy.  No JVD.  No carotid bruit.  No thyromegaly.  CHEST:  Has no focal tenderness.  Somewhat decreased breath sounds in the lower lobes.  No crackles are heard.  HEART:  S1, S2, irregularly irregular, tachycardia, systolic murmur radiating to the carotids.  EXTREMITIES:  1+ edema around the ankles.  Pulses 2+. ABDOMEN:  Bowel sounds plus.  Soft, nontender, nondistended.  No  hepatosplenomegaly.  SKIN:  No rash or lesions.  MUSCULOSKELETAL:  Good range of motion in all the extremities.  NEUROLOGIC:  The patient is alert, oriented to place, person and time.  Cranial nerves II through XII intact.  Motor 5 by 5 in upper and lower extremities.   LABORATORY DATA:  CMP is completely within normal limits.  Troponin of 0.42.  BNP is 13,000.   CBC:  WBC of 4.1, hemoglobin 11.4, platelet count of 161.   EKG, 12-lead:  Atrial fibrillation with rapid ventricular rate of 100 to 106.   ASSESSMENT AND PLAN:  Ms. Cristy FriedlanderFlorence is an 81 year old female who comes to the Emergency Department with shortness of breath.  1.  Congestive heart failure, systolic, acute on chronic.  This is the third admission in the last 60 days, highly concerning about the patient getting decompensated.  Keep the patient on IV Lasix.  Continue to watch closely the BUN and creatinine as well as blood pressure.   2.  Critical aortic stenosis.  The patient has an appointment on 15th of August regarding initial evaluation for this aortic stenosis.   3.  Elevated troponin.  Again, highly concerning this could be from the demand ischemia, however cannot exclude acute caronary syndrome .  The patient recently had heart cath about three weeks back which showed 50% lesions.  4.  Atrial fibrillation with rapid ventricular rate.  We will continue the diltiazem and follow up.  The patient meets a 3 on the CHADS criteria.  The patient is a candidate for anticoagulation, however.  5.  Keep the patient on DVT prophylaxis with Lovenox.   TIME SPENT:  45 minutes.   CODE STATUS:  The patient wishes to be DNR/DNI.    ____________________________ Susa GriffinsPadmaja Chianna Spirito, MD pv:ea D: 09/06/2012 00:55:43 ET T: 09/06/2012 02:34:35 ET JOB#: [id number]  cc: Susa GriffinsPadmaja Chanel Mckesson, MD, <Dictator> Reola MosherAndrew S. Randa LynnLamb, MD Clerance LavPADMAJA Selby Foisy MD ELECTRONICALLY SIGNED 10/01/2012 21:52

## 2014-05-21 NOTE — Discharge Summary (Signed)
PATIENT NAME:  Alison GhaziFLORENCE, [name] MR#:  [id number] DATE OF BIRTH:  Sep 26, 1926  For a detailed note, please see the history and physical done on admission.   DIAGNOSES AT DISCHARGE:  As follows:  1.  Acute congestive heart failure, likely acute-on-chronic systolic dysfunction.  2.  Aortic stenosis, status post bovine aortic valve replacement.  3.  Hypertension.  4.  Hyperlipidemia.  5.  Hyperthyroidism.   DIET: Low sodium, low fat diet.   ACTIVITY: As tolerated.   FOLLOW-UP: Dr. Arnoldo HookerBruce Kowalski in the next 2 to 3 days.   DISCHARGE MEDICATIONS: Ocuvite multivitamin daily, cultural digestive health 1 cap daily, Colace 100 mg b.i.d., oxycodone 5 mg q.6 hours as needed for pain, Pravachol 20 mg daily, Tylenol 650 q.6 hours as needed, MiraLax daily as needed, Senokot 1 tab daily as needed, aspirin 81 mg daily, Plavix 75 mg daily, digoxin 125 mcg daily, lisinopril 2.5 mg daily, Methimazole 10 mg daily, metoprolol tartrate 100 mg b.i.d., Centrum multivitamin daily, tramadol 50 mg q.8 hours as needed, potassium 20 mEq b.i.d. and Lasix 40 mg b.i.d.   CONSULTANTS DURING THE HOSPITAL COURSE: Dr. Arnoldo HookerBruce Kowalski from cardiology.   PERTINENT STUDIES DONE DURING THE HOSPITAL COURSE: A chest x-ray done on admission showing mildly increased interstitial markings. No alveolar edema. A 2-dimensional echocardiogram showing left ventricular ejection fraction to be 30% to 35%,  mild to moderate increased left ventricular cavity size, severely dilated left atrium, moderately dilated right atrium. Aortic valve is normal. Functioning bioprosthetic valve. Moderate to severe tricuspid regurgitation, mildly elevated pulmonary artery systolic pressure, mildly increased left ventricular posterior wall thickness.   HOSPITAL COURSE: This is an 81 year old female with medical problems as mentioned above, who presented to the hospital with shortness of breath and noted to be in congestive heart failure.   1.  Acute congestive  heart failure. This was likely acute-on-chronic systolic dysfunction. The patient presented to the hospital with shortness of breath and slight weight gain. The patient was started on IV Lasix, maintained on her beta blocker and her ACE inhibitors. The patient responded well to IV diuresis and has lost about 3 pounds since admission and is about 2 liters negative since admission and clinically feels much better. The patient was seen by cardiology, and they agreed with this management. She was only taking Lasix once a day prior to coming in. The Lasix dose has now been doubled to 40 mg b.i.d.  2.  Aortic stenosis, status post recent aortic valve replacement. This is a bovine valve. An echocardiogram done in the hospital showed that the bioprosthetic valve is functioning well. The patient [facility].  3.  History of coronary artery disease. The patient did have a episode of chest pain while in the hospital. She did have serial troponins checked but they were slightly elevated, did not trend upwards. They tapered off as high as 0.9. A cardiology consult was obtained. The patient was seen by Dr. Gwen PoundsKowalski, who did not think that the patient's chest pain was related to any acute coronary syndrome but likely related to her underlying valvular heart disease. The patient has had no further episodes of chest pain and no acute EKG changes. She will continue her aspirin, Plavix, beta blocker and statin as stated.  4. Hypertension. The patient remained hemodynamically stable on her metoprolol, Cardizem and lisinopril. She will resume that.  5.  Hypothyroidism. The patient was maintained on her Tapazole and she will resume  that upon discharge too.  6.  Hyperlipidemia. The patient was maintained on her Pravachol and she will resume that upon discharge.   CODE STATUS: FULL CODE.   TIME SPENT DISCHARGE: 40 minutes.    ____________________________ Rolly Pancake. Cherlynn Kaiser,  MD vjs:np D: 10/27/2012 15:25:09 ET T: 10/27/2012 16:25:20 ET JOB#: [id number]  cc: Rolly Pancake. Cherlynn Kaiser, MD, <Dictator> Lamar Blinks, MD Houston Siren MD ELECTRONICALLY SIGNED 11/05/2012 19:24

## 2014-05-21 NOTE — Consult Note (Signed)
PATIENT NAME:  [name], [name] MR#:  [id number] DATE OF BIRTH:  [birth year]  DATE OF CONSULTATION:  01/26/2012  REFERRING PHYSICIAN:  Alford Highland, MD CONSULTING PHYSICIAN:  Lamar Blinks, MD  REASON FOR CONSULTATION: Acute on chronic congestive heart failure with aortic stenosis and chronic atrial fibrillation.   CHIEF COMPLAINT: "I'm short of breath."   HISTORY OF PRESENT ILLNESS: This is an 81 year old female with known severe aortic stenosis, mild LV systolic dysfunction, valvular heart disease, hypertension, atrial fibrillation, with acute on chronic congestive heart failure. The patient has had known appropriate medication management for congestive heart failure and atrial fibrillation including Cardizem at a low dose to reduce the side effects but keep medication management appropriate with the patient having heart rate control and maintenance of normal rhythm. The patient has not used any anticoagulation due to the fact she has had bleeding complications in the past. In addition, the aortic valve stenosis that she has had is severe but the patient wishes not to pursue further intervention including surgical treatment. The patient has had some weight gain, shortness of breath, pulmonary edema consistent with congestive heart failure. Intravenous Lasix has helped a great deal to improve her symptoms at this time.   REVIEW OF SYSTEMS: The remainder review of systems negative for vision change, ringing in the ears, hearing loss, cough, congestion, heartburn, nausea, vomiting, diarrhea, bloody stools, stomach pain, extremity pain, leg weakness, cramping of the buttocks, known blood clots, headaches, blackouts, dizzy spells, nosebleeds, congestion, trouble swallowing, frequent urination, urination at night, muscle weakness, numbness, anxiety, depression, skin lesions or skin rashes.   PAST MEDICAL HISTORY: 1.  Congestive heart failure.  2.  Aortic stenosis.  3.  Atrial fibrillation.  4.   Hypertension.   FAMILY HISTORY: No family members with early onset of cardiovascular disease.   SOCIAL HISTORY: Currently denies alcohol or tobacco use.   ALLERGIES: AS LISTED.   PHYSICAL EXAMINATION: VITAL SIGNS: Blood pressure is 126/68 bilaterally, heart rate 72 upright reclining and regular.  GENERAL: She is a well appearing female in no acute distress.  HEENT: No icterus, thyromegaly, ulcers, hemorrhage or xanthelasma.  HEART: Regular rate and rhythm with normal S1, soft S2, with a 3 out of 6 right upper sternal border murmur radiating throughout and into the carotids. PMI is diffuse. Carotid upstroke normal without bruit but murmur radiation.  LUNGS: Lungs have bibasilar crackles with normal respirations.  ABDOMEN: Soft, nontender, without hepatosplenomegaly or masses. Abdominal aorta is normal size without bruits.  EXTREMITIES: Show 2+ bilateral pulses in dorsal pedal, radial and femoral arteries without lower extremity edema, cyanosis, clubbing or ulcers.  NEUROLOGIC: She is oriented to time, place and person with normal mood and affect.   ASSESSMENT: An 81 year old female with severe aortic stenosis, chronic atrial fibrillation, hypertension, hyperlipidemia, with acute on chronic congestive heart failure.   RECOMMENDATIONS: 1.  Echocardiogram for re-evaluation of extent of LV systolic dysfunction and aortic stenosis with adjustments of medications.  2.  Intravenous Lasix and changing to oral Lasix tomorrow for improvement of edema, watching for chronic kidney disease.  3.  Continue heart rate control with Cardizem, which has worked fairly well.  4.  No anticoagulation due to bleeding concerns in the past.  5. No further intervention cardiac-wise due to the patient's wishes not to pursue aortic valve surgery.     ____________________________ Lamar Blinks, MD bjk:cs D: 01/27/2012 08:59:00 ET T: 01/27/2012 20:11:56 ET JOB#: [id number]  cc: Lamar Blinks, MD,  <Dictator> Quentin Cornwall  Gwen PoundsKOWALSKI MD ELECTRONICALLY SIGNED 02/14/2012 8:24

## 2014-05-21 NOTE — Discharge Summary (Signed)
PATIENT NAME:  Alison GhaziFLORENCE, [name] MR#:  [id number] DATE OF BIRTH:  1927-01-16  DATE OF ADMISSION:  01/26/2012 DATE OF DISCHARGE:  01/27/2012  PRIMARY CARE PHYSICIAN: Alison BucklerAndrew Lamb, MD  FINAL DIAGNOSES: 1. Acute respiratory failure.  2. Acute systolic congestive heart failure with valvular abnormalities including severe aortic stenosis and moderate to severe mitral regurgitation.  3. Atrial fibrillation.  4. Elevated troponin.   MEDICATIONS ON DISCHARGE: 1. Losartan 50 mg daily.  2. Metoprolol 50 mg daily. 3. Aspirin 81 mg daily.  4. Acetaminophen/tramadol 325/37.5 mg twice a day.  5. Potassium chloride 40 mEq daily with Lasix. 6. Centrum Silver 1 tablet daily.  7. Ocuvite 1 tablet daily.  8. Furosemide 40 mg twice a day. 9. Diltiazem 120 mg extended-release 1 tablet daily.   DIET: Low sodium diet, less than 2300 mg sodium chloride per day, regular consistency.   ACTIVITY: Activity as tolerated.   DISCHARGE FOLLOW-UP:  Follow up this week with [name]. Call this Monday for an appointment. Follow up in 1 to 2 weeks with Dr. [name].   REASON FOR ADMISSION: The patient was admitted 01/26/2012 and discharged 01/27/2012. The patient came in with shortness of breath.   HISTORY OF PRESENT ILLNESS: This is an 81 year old female with history of severe aortic stenosis, coronary artery disease, hypertension and paroxysmal atrial fibrillation who presented with shortness of breath going on for a few weeks, gradually. In the Emergency Room, she was found to be hypoxic. She was given Lasix. She was admitted with acute congestive heart failure and started on IV Lasix.  LABS/RADIOLOGIC STUDIES: BNP 4170. Troponin borderline at 0.11. Glucose 123, BUN 18, creatinine 0.72, sodium 141, potassium 3.4, chloride 105, CO2 27 and calcium 9.2. Liver function tests normal. White blood cell count 6.7, hemoglobin and hematocrit 11.7 and 36.5 and platelet count 222.   Chest x-ray showed congestive heart  failure with interstitial edema.   Echocardiogram showed EF of 40%, left atrium severely dilated, right atrium mild to moderately dilated, moderate to severe mitral regurgitation, moderate tricuspid regurgitation and severe valvular aortic stenosis.  Next troponin borderline at 0.09. White blood cell count upon discharge 4.1, hemoglobin upon discharge 11.2 and platelet count 189. Chemistry within normal range upon discharge.   HOSPITAL COURSE PER PROBLEM LIST:  1. For the patient's acute respiratory failure, this had improved. The patient did require oxygen during the hospital stay, initially. Pulse oximetry on 2 liters was 95%. The patient was hypoxic in the Emergency Room. I am not seeing any specific documentation in the ER on the hypoxia, but pulse oximetry 95% on 2 liters qualifies. Upon discharge pulse oximetry with exertion was 95%.  2. Acute systolic congestive heart failure with valvular abnormalities, severe aortic stenosis and moderate to severe mitral regurgitation. The patient was seen in consultation by [name] who will follow up closely as outpatient. The combination of congestive heart failure with severe aortic stenosis is a poor prognosis, high mortality within the year. [name] to review the patient's records to see if the patient is a candidate for valve replacement surgery. The patient knows that the prognosis is poor and is hesitant on doing any intervention besides medical management. The patient's Lasix was increased from 40 mg once a day to twice a day. The patient is already on metoprolol and losartan. Careful with afterload reduction with severe AS. The patient's lungs were clear upon discharge.  3. Atrial fibrillation history. The patient is on aspirin only for anticoagulation. She had bleeding  with Xarelto in the past. She is on Cardizem and metoprolol for rate control.     4. Elevated troponin most likely secondary to respiratory failure and heart failure. No  cardiac intervention, as per Dr. Gwen West.  TIME SPENT ON DISCHARGE: 35 minutes.   ____________________________ Herschell Dimes. Renae Gloss, MD rjw:sb D: 01/27/2012 14:56:00 ET T: 01/28/2012 07:52:23 ET JOB#: [id number]  cc: Herschell Dimes. Renae Gloss, MD, <Dictator> [name]. Alison Lynn, MD Alison Blinks, MD Alison Scarlet MD ELECTRONICALLY SIGNED 01/31/2012 19:07

## 2014-05-21 NOTE — H&P (Signed)
PATIENT NAME:  [name], [name] MR#:  [id number] DATE OF BIRTH:  [birth year]  DATE OF ADMISSION:  07/10/2012  PRIMARY CARE PROVIDER: Reola MosherAndrew S. Randa LynnLamb, MD  EMERGENCY DEPARTMENT REFERRING PHYSICIAN: Enedina FinnerRandolph N. Manson PasseyBrown, MD   CHIEF COMPLAINT: Recurrent palpitations, shortness of breath.   HISTORY OF PRESENT ILLNESS: The patient is an 81 year old white female with history of having severe aortic stenosis, who has been told that she is not a surgical candidate, who presents with complaint of having palpitations for the past 10 days. She was seen by her primary cardiologist, Dr. Gwen PoundsKowalski, and reports that her aspirin was changed from 81 to 325, and her ACE inhibitor was discontinued, and then there was some change in another medication she is not sure of. She reports that also over the past few days she has been having shortness of breath and wheezing, more than usual. She is chronically short of breath due to valvular disease. The patient, in the ED, has had intermittent heart rate into the 130s, 140s. She currently denies any chest pains or palpitations. States that her neck feels like there is pressure there. She does complain of chronic swelling of her lower left leg. She also complains of nocturnal dyspnea, orthopnea and PND. She denies any abdominal pain, nausea, vomiting or diarrhea.   PAST MEDICAL HISTORY:  1. History of severe aortic stenosis, not a surgical candidate.  2. Coronary artery disease, status post stent.  3. Questionable history of pulmonary fibrosis.  4. Hypertension.  5. History of paroxysmal atrial fibrillation.  6. Osteoarthritis.  7. Osteoporosis.  8. History of spinal scoliosis and spinal stenosis.  9. Recurrent UTI.  10. Hyperlipidemia.  11. Status post appendectomy.  12. Status post hysterectomy.  13. Status post varicose vein surgery.  14. Status post hemorrhoidectomy.  15. Status post fibroid removal.  16. Status post cardiac stent placed in 2007.   ALLERGIES: KEFLEX,  PENICILLIN, PREDNISONE AND SULFA.   SOCIAL HISTORY: Retired from working in [employer]. Nonsmoker, used to smoke 30 years ago and currently quit. Drinks wine occasionally.   FAMILY HISTORY: Significant for lung cancer and breast cancer in her mother, also coronary artery disease in sister.   REVIEW OF SYSTEMS:  CONSTITUTIONAL: Denies any fevers or chills. No significant weight loss or weight gain. Complains of fatigue and weakness.  EYES: Denies any blurred vision, double vision or pain. Does have cataracts. No glaucoma.  ENT: Denies any tinnitus, ear pain. No hearing loss. No epistaxis. No difficulty swallowing.  RESPIRATORY: Complains of shortness of breath. Complains of some wheezing. Denies any hemoptysis, cough. No COPD. No history of pneumonia.  CARDIOVASCULAR: Currently denies any chest pain, but does have occasional intermittent chest pains. Complains of chest palpitations. Has atrial fibrillation. Does complain of orthopnea. Has arrhythmia. No syncope. Complains of palpitations.  GASTROINTESTINAL: Denies any nausea, vomiting, diarrhea, abdominal pain, hematemesis or hematochezia. No changes in bowel habits.  GENITOURINARY: Denies any dysuria, hematuria or renal colic.  ENDOCRINE: Denies any polyuria, polydipsia, heat or cold intolerance.  HEMATOLOGIC: Denies any anemia or easy bruisability.  SKIN: Denies any acne, rash or skin lesions.  MUSCULOSKELETAL: Has chronic arthritis and pain related to that.  NEUROLOGICAL: Denies any CVA, TIA or seizures.  PSYCHIATRIC: Denies any anxiety, depression or nervousness.   PHYSICAL EXAMINATION:  VITAL SIGNS: Temperature 98.1, pulse 96, respirations 24, blood pressure 157/91, O2 99%.  GENERAL: The patient is a well-developed Caucasian female, currently not in any acute distress.  HEENT: Head atraumatic, normocephalic. Pupils equally round and  reactive to light and accommodation. There is no conjunctival pallor. No scleral icterus. Nasal exam shows  no drainage or ulceration. External ear exam shows no tenderness or erythema. Oropharynx is clear without any exudates.  NECK: Supple. No thyromegaly. No JVD.  CARDIOVASCULAR: Irregularly irregular rhythm. There is a systolic murmur heard at the left sternal border. PMI is not displaced.  LUNGS: Clear to auscultation bilaterally without any rales, rhonchi or wheezing.  ABDOMEN: Soft, nontender, nondistended. Positive bowel sounds x4. There is no hepatosplenomegaly.  EXTREMITIES: She has got no clubbing or cyanosis. Edema, especially in the left lower extremity, which is chronic.  SKIN: There is no rash or any ulcerations.  MUSCULOSKELETAL: There is no erythema or swelling.  VASCULAR: Good DP, PT pulses.  PSYCHIATRIC: Not anxious or depressed.  NEUROLOGIC: Awake, alert, oriented x3. No focal deficits.   DIAGNOSTIC STUDIES: EKG showed atrial fibrillation. Glucose 110, BUN 21, creatinine 0.85, sodium 139, potassium 3.3, chloride 104, CO2 was 29, calcium 9.1. Troponin 0.10. WBC 5.9, hemoglobin 11.5, platelet count 186.   ASSESSMENT AND PLAN: The patient is an 81 year old white female who presents with 10-day history of palpitations, shortness of breath, with known atrial fibrillation.   1. Atrial fibrillation with intermittent rapid ventricular response. At this time, will place her on Cardizem q.6. Continue metoprolol and aspirin. Likely cause for her atrial fibrillation would be the patient's aortic stenosis could be getting worse.  2. Severe aortic stenosis. Will repeat echo. The patient should have a CT surgeon evaluation as an outpatient for any further recommendations.  3 .Elevated troponins. Will cycle cardiac enzymes. Continue aspirin.  4. Hypokalemia. Will replace her potassium. Will also check a magnesium.  5. History of hyperlipidemia, currently not on any treatment. Will check a fasting lipid panel in the morning.  6. Miscellaneous. Will place the patient on Lovenox for deep vein  thrombosis prophylaxis.   CODE STATUS: Full. I will ask the palliative care team to assist with code status in light of severe aortic stenosis and her code is full.   TIME SPENT: 45 minutes spent on H and P.  ____________________________ Lacie Scotts. Allena Katz, MD shp:OSi D: 07/10/2012 08:32:49 ET T: 07/10/2012 09:32:18 ET JOB#: [id number]  cc: Jaliah Foody H. Allena Katz, MD, <Dictator> Charise Carwin MD ELECTRONICALLY SIGNED 07/12/2012 15:26

## 2014-05-21 NOTE — H&P (Signed)
PATIENT NAME:  [name], CLIMER MR#:  [id number] DATE OF BIRTH:  [birth year]  DATE OF ADMISSION:  08/05/2012  PRIMARY CARE PHYSICIAN: Reola Mosher. Randa Lynn, MD   CARDIOLOGIST: Lamar Blinks, MD  CHIEF COMPLAINT: Shortness of breath and chest heaviness.   HISTORY OF PRESENT ILLNESS: This is an 81 year old female who presents to the hospital due to acute onset of shortness of breath and chest heaviness that began earlier this morning. She describes her symptoms as that she develops significant shortness of breath acutely and then became suddenly very weak, became diaphoretic and had difficulty even lifting her arms. She then developed some chest pressure along with some dizziness and lightheadedness. She therefore came to the ER for further evaluation. Upon arrival to the ER, the patient was noted to be in rapid atrial fibrillation with heart rates in the 130s. She was given 1 dose of IV Cardizem, and it slowed her heart rate down. She clinically feels much better once her heart rate has been better controlled. She denies any chest pain, shortness of breath, nausea, vomiting or lightheadedness presently. The patient was noted to have a mildly elevated troponin of 0.08. Hospitalist services were contacted for treatment and evaluation.   REVIEW OF SYSTEMS:  CONSTITUTIONAL: No documented fever. No weight gain, no weight loss.  EYES: No blurred or double vision.  ENT: No tinnitus. No postnasal drip. No redness of the oropharynx.  RESPIRATORY: No cough, no wheeze, no hemoptysis. Positive dyspnea.  CARDIOVASCULAR: Positive chest pain. No orthopnea, no palpitations, no syncope.  GASTROINTESTINAL: No nausea, no vomiting, no diarrhea. No abdominal pain. No melena or hematochezia.  GENITOURINARY: No dysuria, no hematuria.  ENDOCRINE: No polyuria or nocturia. No heat or cold intolerance.  HEMATOLOGIC: No anemia, no bruising, no bleeding.  INTEGUMENTARY: No rashes. No lesions.  MUSCULOSKELETAL: No arthritis. No  swelling. No gout.   NEUROLOGIC: No numbness or tingling. No ataxia. No seizure-type activity.  PSYCHIATRIC: No anxiety, no insomnia, no ADD.   PAST MEDICAL HISTORY: Consistent with:  1. Severe aortic stenosis.  2. History of congestive heart failure secondary to systolic dysfunction, cardiomyopathy, ejection fraction 25% to 30%.  3. Paroxysmal atrial fibrillation.  4. Hypertension.  5. Osteoporosis.  6. Scoliosis.   ALLERGIES: KEFLEX, PENICILLIN, PREDNISONE AND SULFA.   SOCIAL HISTORY: Used to be a smoker, quit 30+ years ago. No alcohol abuse. No illicit drug abuse. Lives at home by herself.   FAMILY HISTORY: Mother died from complications of breast cancer. Father died from myocardial infarction.   CURRENT MEDICATIONS: As follows:  1. Aspirin 325 mg daily. 2. Cardizem CD 240 mg daily.  3. Metoprolol tartrate 25 mg b.i.d. 4. Lasix 40 mg b.i.d., 5. Tylenol with tramadol 325/37.5 one tab every 4 hours needed for pain.  6. Multivitamin daily. 7. Ocuvite 1 tab daily.  8. Potassium 20 mEq daily.  9. Probiotic 1 cap daily.   PHYSICAL EXAMINATION: Presently, is as follows: VITAL SIGNS: Temperature: The patient is afebrile. Pulse 97, respirations 20, blood pressure 138/66, sat is 94% on 2 liters nasal cannula.  GENERAL: She is a pleasant-appearing female in no apparent distress.  HEAD, EYES, EARS, NOSE AND THROAT: Atraumatic, normocephalic. Extraocular muscles are intact. Pupils are equal and reactive to light. Sclerae anicteric. No conjunctival injection. No pharyngeal erythema.  NECK: Supple. There is no jugular venous distention, no bruits, no lymphadenopathy, no thyromegaly.  HEART: Irregular. She does have a 2/6 systolic ejection murmur heard at the right sternal border. No rubs. No clicks.  LUNGS: Clear to auscultation bilaterally. No rales or rhonchi. No wheezes.  ABDOMEN: Soft, flat, nontender, nondistended. Has good bowel sounds. No hepatosplenomegaly appreciated.  EXTREMITIES:  No evidence of any cyanosis, clubbing or peripheral edema. Has +2 pedal and radial pulses bilaterally.  NEUROLOGICAL: The patient is alert, awake and oriented x 3 with no focal motor or sensory deficits appreciated bilaterally.  SKIN: Moist and warm with no rash appreciated.  LYMPHATIC: There is no cervical or axillary lymphadenopathy.   LABORATORY DATA: Showed a serum glucose of 102, BUN 16, creatinine 0.8, sodium 140, potassium 3.9, chloride 104, bicarbonate 29, troponin 0.08. White cell count is 4.8, hemoglobin 11.5, hematocrit 34.4, platelet count 185.   ASSESSMENT AND PLAN: This is an 81 year old female with a history of hypertension, severe aortic stenosis, history of congestive heart failure, paroxysmal atrial fibrillation, who presents to the hospital due to shortness of breath, weakness and chest pressure and noted to be in rapid atrial fibrillation.  1. Shortness of breath. This is likely multifactorial in nature, related to her underlying atrial fibrillation with rapid ventricular response, also her underlying severe aortic stenosis and mild congestive heart failure. Her symptoms have clinically improved as her heart rates has been better controlled with some IV Cardizem. Will also continue diuresis with oral Lasix that she takes at home and follow her clinically. Continue with rate control for atrial fibrillation with oral metoprolol and Cardizem.  2. Elevated troponin. This is likely in the setting of rapid atrial fibrillation and underlying severe aortic stenosis. She currently has no chest pain or pressure and hemodynamically stable. She has no acute EKG changes. Continue to observe on telemetry. Continue aspirin, beta blocker. Await further cardiology evaluation. I discussed the case with Dr. Darrold JunkerParaschos, who will see the patient.  3. Paroxysmal atrial fibrillation. The patient's rates were uncontrolled initially, but much better now with some IV Cardizem. I will continue oral metoprolol and  Cardizem for rate control. Continue aspirin for anticoagulation.  4. History of congestive heart failure. This is acute on chronic systolic congestive heart failure. Clinically, she does not appear to be in congestive heart failure. I will continue her Lasix, beta blocker. I would consider starting her on low-dose ACE inhibitor given her severe LV dysfunction as long as her blood pressure can tolerate it. 5. Severe aortic stenosis. Most of the patient's clinical symptoms are related to her aortic stenosis. [facility] for possible evaluation for valve replacement, whether she needs an open-heart surgery with valve replacement or transcatheter valve replacement. This will be further discussed and stratified. I discussed the case with Dr. Darrold JunkerParaschos, who will see the patient. The patient likely needs a preoperative cardiac catheterization to figure out the cause of her left ventricular dysfunction, whether it is aortic stenosis or whether it is underlying coronary artery disease.   CODE STATUS: The patient is a full code.   TIME SPENT ON ADMISSION: 50 minutes.   ____________________________ Rolly PancakeVivek J. Cherlynn KaiserSainani, MD vjs:OSi D: 08/05/2012 14:05:55 ET T: 08/05/2012 14:17:34 ET JOB#: [id number]  cc: Rolly PancakeVivek J. Cherlynn KaiserSainani, MD, <Dictator> Houston SirenVIVEK J Elgie Maziarz MD ELECTRONICALLY SIGNED 08/11/2012 20:32

## 2014-05-21 NOTE — Consult Note (Signed)
Brief Consult Note: Diagnosis: AFIB/AS/CHF/COPD.   Patient was seen by consultant.   Consult note dictated.   Recommend further assessment or treatment.   Orders entered.   Discussed with Attending MD.   Comments: IMP AFIB-RVR CHF COPD CM AS-severe Edema . PLAN Tele Cardiazem Rate control Agree with ECHO Lasix Short term anticoug Poor surgical candidate for left ear May consider Core Valve? Inhalersfor lung disease Antiarrthymic?.  Electronic Signatures: Dorothyann Pengallwood, Zygmund Passero D (MD)  (Signed 13-Jun-14 07:26)  Authored: Brief Consult Note   Last Updated: 13-Jun-14 07:26 by Alwyn Peaallwood, Siegfried Vieth D (MD)

## 2014-05-21 NOTE — Discharge Summary (Signed)
PATIENT NAME:  Alison GhaziFLORENCE, [name] MR#:  [id number] DATE OF BIRTH:  1926-07-03  DATE OF ADMISSION:  07/10/2012  DATE OF DISCHARGE:  07/11/2012  ADMITTING DIAGNOSES: Palpitations, shortness of breath.   DISCHARGE DIAGNOSES: 1. Recurrent palpitations due to intermittent atrial fibrillation with rapid ventricular response, now heart rate improved. Changes to her Cardizem are made.  2. Severe aortic stenosis. The patient now interested in being seen by surgery. [facility]  3. Coronary artery disease, status post stent.  4. Hypertension.  5. History of paroxysmal atrial fibrillation.  6. Osteoarthritis.  7. Osteoporosis.  8. History of spinal scoliosis and spinal stenosis.  9. Recurrent urinary tract infections.  10. Hyperlipidemia.  11. Status post appendectomy.  12. Status post hysterectomy.  13. Status post varicose vein surgery.  14. Status post hemorrhoidectomy.  15. Status post fibroid removal.  16. Status post cardiac a stent in 2007.  17. Troponin looked slightly elevated likely due to demand ischemia.   PERTINENT LABS AND EVALUATIONS: EKG showed atrial fibrillation septal infarct. TSH 1.33, magnesium was 2.0, troponin 0.10. BMP glucose 110, BUN 21, creatinine 0.85, sodium 139, potassium 3.3, chloride 104, CO2 was 29, calcium 9.1. WBC 5.9, hemoglobin 11.5, platelet count 186. Chest x-ray showed bilateral interstitial opacities are similar to prior. This could be related to interstitial edema. Urinalysis was nitrite negative, leukocytes negative. Echocardiogram of the heart showed ejection fraction 25% to 30% severely decreased global left ventricular systolic function, moderately reduced RV systolic function, moderately dilated left atrium, mildly dilated right atrium, mild mitral valve regurg, severe aortic valve stenosis, mildly elevated pulmonary hypertension. Troponin was 0.10 and then subsequently 0.11.   HOSPITAL COURSE: Please see H and P done by me  yesterday. The patient is an 81 year old white female with known severe aortic stenosis presented with complaint of having palpitations, for the past 10 to 12 days as well as shortness of breath. The patient was admitted to the hospital under observation, monitor of her heart rate and adjustment of her medications. She was placed on a Cardizem q. 6 hours; with that, her heart rate improved significantly. Now, it is in the 80s. At this time, she is doing much better. She was seen by cardiology. She will need outpatient follow up.  For her valvular dysfunction, she wants to be seen by CT surgery, so [facility].   DISCHARGE MEDICATIONS: Metoprolol succinate 25 mg daily, Lasix 40 two tabs daily, potassium chloride 20 mg 1 tab p.o. b.i.d., aspirin 325 one tab p.o. daily, tramadol 1 tab p.o. as needed, Cardizem CD 240 daily.   DIET: Regular.   ACTIVITY: As tolerated.   FOLLOWUP: Follow with Dr. Gwen PoundsKowalski in 1 to 2 weeks, [facility] to [facility] for severe aortic stenosis.   The patient is now DO NOT RESUSCITATE.   NOTE: 32 minutes spent. Thank you   ____________________________ Lacie ScottsShreyang H. Allena KatzPatel, MD shp:rw D: 07/11/2012 13:05:42 ET T: 07/11/2012 13:24:33 ET JOB#: [id number]  cc: Lanore Renderos H. Allena KatzPatel, MD, <Dictator> Charise CarwinSHREYANG H Amilliana Hayworth MD ELECTRONICALLY SIGNED 07/12/2012 15:26

## 2014-05-22 NOTE — H&P (Signed)
PATIENT NAME:  [name], [name] MR#:  [id number] DATE OF BIRTH:  1926/10/20  DATE OF ADMISSION:  01/06/2014  REFERRING PHYSICIAN: Harvest Dark, MD  PRIMARY CARE PHYSICIAN: Dr. Baldemar Lenis, MD  ADMISSION DIAGNOSIS: Decompensated heart failure.   HISTORY OF PRESENT ILLNESS: This is an 81 year old Caucasian female who presents to the Emergency Department via EMS for shortness of breath. The patient states that her breathing difficulty has been gradually worsening over the last 4 days. It began when she developed a sore throat and cough. The latter is slightly productive of some blood-tinged sputum, although notably she states that it only became pink in color today. She does not wear oxygen at home, but required 2 liters of oxygen via EMS on transport to the Emergency Department. On arrival to the Emergency Department, her chest x-ray appeared to be slightly consistent with some interstitial edema and her lungs sounded somewhat like pulmonary edema, which prompted the Emergency Department to give her IV Lasix. By the time of my exam, the patient was no longer on supplemental oxygen and states that she felt somewhat better; however, she continued to have a wet sounding cough. Due to her apparent decompensation in cardiac output, the Emergency Department called for admission.   REVIEW OF SYSTEMS: CONSTITUTIONAL: The patient denies fever, but admits to gradually increasing fatigue.  EYES: Denies inflammation or blurred vision.  EARS, NOSE AND THROAT: Denies tinnitus, but admits to sore throat.  RESPIRATORY: Admits to cough and shortness of breath.  CARDIOVASCULAR: Denies chest pain or palpitations and also denies orthopnea and paroxysmal nocturnal dyspnea. GASTROINTESTINAL: The patient admits to nausea and vomiting this morning. It was nonbloody and nonbilious. She denies abdominal pain or diarrhea.  GENITOURINARY: Denies dysuria, increased frequency or hesitancy of urination. HEMATOLOGIC AND LYMPHATIC:  Denies easy bruising or bleeding.  INTEGUMENT: Denies rashes or lesions.  MUSCULOSKELETAL: Admits to generalized aches and pains, but denies any specific myalgias.  NEUROLOGIC: Denies numbness in her extremities or difficulty speaking.  PSYCHIATRIC: Denies depression or suicidal ideation.   PAST MEDICAL HISTORY: Hyperthyroidism, congestive heart failure, hyperlipidemia, and hiatal hernia.   PAST SURGICAL HISTORY: The patient recently had a transthoracic aortic valve repair, hysterectomy, hemorrhoidectomy, varicose vein surgeries, tonsillectomy and adenoidectomy, lumpectomy of her breasts bilaterally and appendectomy.   FAMILY HISTORY: Her mother is deceased of breast cancer.   SOCIAL HISTORY: The patient lives alone. She occasionally drinks a glass of wine. She quit smoking 34 years ago. She does not use any illicit drugs.   MEDICATIONS: 1.  Aspirin 325 mg 1 tab p.o. daily.  2.  Centrum Silver multivitamin 1 tab p.o. daily.  3.  Digoxin 125 mcg 1 tablet p.o. daily.  4.  Furosemide 40 mg 1 tablet p.o. b.i.d.  5.  Lisinopril 2.5 mg 1 tablet p.o. daily.  6.  Methimazole 5 mg 2 tablets p.o. every morning.  7.  Metoprolol 100 mg 1 tablet p.o. every 12 hours.  8.  Ocuvite antioxidant multivitamins 1 tablet p.o. daily.  9.  Potassium chloride 20 mEq extended release 1 tablet p.o. b.i.d.  10.  Pravastatin 20 mg 1 tablet p.o. at bedtime.  11.  Senokot 8.6 mg 1 tablet p.o. daily as needed for constipation.   ALLERGIES: KEFLEX, PENICILLIN, PREDNISONE AND SULFA DRUGS.   PERTINENT LABORATORY RESULTS AND RADIOGRAPHIC FINDINGS: Serum glucose 135. BNP 10,726. BUN 15, creatinine 0.67, serum sodium 132, potassium 3.3, chloride 96, bicarb 27, calcium 8.6, serum albumin 3.4, alk phos 76, AST 27, ALT 26. Troponin 0.25. White  blood cell count 7.9, hemoglobin 10.5, hematocrit 32.6, platelet count 135,000, MCV 83.   Chest x-ray shows stable cardiomegaly with chronic bronchitic changes without focal  consolidation.   PHYSICAL EXAMINATION: VITAL SIGNS: Temperature is 97.5, pulse 92, respirations 17, blood pressure 156/54 and pulse ox 95% on room air.  GENERAL: The patient is alert and oriented x3, in no apparent distress.  HEENT: Normocephalic, atraumatic. Pupils equal, round, and reactive to light and accommodation. Extraocular movements are intact. Mucous membranes are moist.  NECK: Trachea is midline. No adenopathy.  CHEST: The patient's chest is not symmetric as she has a severe scoliosis. CARDIOVASCULAR: Irregularly irregular rhythm, tachycardic. Normal S1, S2. No rubs, clicks, or murmurs appreciated.  LUNGS: Sound coarse bilaterally, but she has breath sounds throughout. There is no rhonchi or egophony on physical examination.  ABDOMEN: Positive bowel sounds. Soft, nontender, nondistended. No hepatosplenomegaly.  GENITOURINARY: Deferred.  MUSCULOSKELETAL: The patient moves all 4 extremities equally. There is 5/5 strength in upper and lower extremities bilaterally.  SKIN: No rashes or lesions.  EXTREMITIES: No clubbing, cyanosis, or edema.  NEUROLOGIC: Cranial nerves II through XII are grossly intact.  PSYCHIATRIC: Mood is normal. Affect is congruent.   ASSESSMENT AND PLAN: This is an 81 year old female admitted for decompensated heart failure.  1.  Heart failure is systolic and likely right-sided. It could have been precipitated by upper respiratory symptoms. Initially she had an oxygen requirement, which is now resolved following a dose of IV Lasix. We will resume her oral Lasix dosing and continue lisinopril and digoxin for symptomatic relief.  2.  Elevated troponin. This is likely secondary to demand secondary to tachycardia and increased work of breathing. We will follow the patient's biomarkers.  3.  Atrial fibrillation. This is persistent and she has uncontrolled rate. This could be contributing to decompensation. We will continue metoprolol and methimazole, but we may need to  adjust both. She is not currently on any anticoagulation, presumably due to fall risk, although this is unclear as the patient seems to be very sharp and active including going to a local gym 3 days a week.  4.  Hyperthyroidism. This is likely worsened since the patient's viral illness began. She also reports a recent reduction in the dose of her methimazole, which may be contributing to the exacerbation in atrial fibrillation. We will check her thyroid stimulating hormone and adjust her medications as needed.  5.  Hyperlipidemia. Continue statin therapy.  6.  Hyponatremia. This is likely secondary to volume overload. Perhaps it will correct with some diuresis. If not, the patient may need some fluid restriction upon discharge.  7.  Deep vein thrombosis prophylaxis. Heparin.  8.  Gastrointestinal prophylaxis. None.   CODE STATUS: The patient is a FULL code.   TIME SPENT ON ADMISSION ORDERS AND PATIENT CARE: Approximately 35 minutes.   ____________________________ Norva Riffle. Marcille Blanco, MD msd:sb D: 01/06/2014 08:50:35 ET T: 01/06/2014 09:23:36 ET JOB#: [id number]  cc: Norva Riffle. Marcille Blanco, MD, <Dictator> Norva Riffle Fernanda Twaddell MD ELECTRONICALLY SIGNED 01/15/2014 0:10

## 2014-05-22 NOTE — Consult Note (Signed)
PATIENT NAME:  [name], Emmelia J MR#:  [id number] DATE OF BIRTH:  [birth year]  DATE OF CONSULTATION:  01/06/2014  REFERRING PHYSICIAN:  Kelton PillarMichael S. Sheryle Hailiamond, MD CONSULTING PHYSICIAN:  Lamar BlinksBruce J. Ferdinando Lodge, MD  REASON FOR CONSULTATION: Acute systolic dysfunction, congestive heart failure, atrial fibrillation, hypertension, hyperlipidemia, aortic valve stenosis, anemia and elevated troponin.   CHIEF COMPLAINT: " I got short of breath and cough."   HISTORY OF PRESENT ILLNESS: This is an 81 year old female with known aortic valve stenosis, status post aortic valve replacement in the recent past, with chronic atrial fibrillation without anticoagulation due to bleeding concerns. The patient has had mixed hyperlipidemia, stable at this time, but has had severe cough, cold and congestion with concerns of bronchitis for which she was placed on antibiotic. This has slightly improved her symptoms, but has exacerbated concerns of diastolic and/or systolic dysfunction, congestive heart failure for which she has had in the past. She has had an elevated troponin at 2.25, more consistent with current illness and hypoxia rather than acute coronary syndrome. She does have a BNP of 10,726, more consistent with acute congestive heart failure with exacerbation of anemia. Current EKG shows atrial fibrillation with septal myocardial infarction and controlled ventricular rate.   REVIEW OF SYSTEMS: The remainder review of systems negative for vision change, ringing in the ears, hearing loss, heartburn, nausea, vomiting, diarrhea, bloody stools, stomach pain, extremity pain, leg weakness, cramping of the buttocks, known blood clots, headaches, blackouts, dizzy spells, nosebleeds, frequent urination, urination at night, muscle weakness, numbness, anxiety, depression, skin lesions or skin rashes.   PAST MEDICAL HISTORY:  1.  Severe aortic stenosis, status post replacement.  2.  Coronary artery disease.  3.  Atrial fibrillation.  4.   Hypertension.  5.  Hyperlipidemia. 6.  Anemia.   FAMILY HISTORY: No family members with early onset of cardiovascular disease or hypertension.   SOCIAL HISTORY: Currently denies alcohol or tobacco use.   ALLERGIES:as listed   PHYSICAL EXAMINATION:  VITAL SIGNS: Blood pressure is 110/68 bilaterally. Heart rate is 78 upright, reclining and irregular.  GENERAL: She is a well-appearing elderly female in no acute distress.  HEENT: No icterus, thyromegaly, ulcers, hemorrhage or xanthelasma.  CARDIOVASCULAR: Irregularly irregular with normal S1, soft S2, with a 2/6 right upper sternal border murmur radiating throughout into the carotids. PMI is inferiorly displaced. Carotid upstroke normal, cannot assess jugular venous pressure.  LUNGS: Have bibasilar crackles, expiratory wheezes and rhonchi.  ABDOMEN: Soft, nontender. No hepatosplenomegaly or masses. Abdominal aorta is normal size without bruit.  EXTREMITIES: Show 2+ radial, trace femoral, trace lower extremity edema. No cyanosis, clubbing or ulcers.  NEUROLOGIC: She is oriented to time, place and person with normal mood and affect.   ASSESSMENT: An 81 year old female with acute congestive heart failure of unknown systolic or diastolic at this time with elevated BNP without evidence of myocardial infarction with demand ischemia, elevation of troponin and aortic stenosis, status post replacement, nonrheumatic in nature, with mixed hyperlipidemia, essential hypertension, and nonvalvular atrial fibrillation needing further treatment options.   RECOMMENDATIONS:  1.  Beta blocker as necessary for heart rate control of nonvalvular chronic atrial fibrillation.  2.  Hypertension control with beta blocker as well as if able.  3.  Mixed hyperlipidemia. Treatment with statin therapy with moderate intensive cholesterol therapy.  4.  Consider echocardiogram for LV systolic dysfunction and cause of acute congestive heart failure and assessment of significance  of aortic valve stenosis.  5.  No anticoagulation with atrial fibrillation due to  bleeding risks in the past.  6.  Antibiotics with other inhalers for treatment of bronchitis and infection.  7.  Intravenous Lasix for pulmonary edema.  8.  Begin ambulation and follow for improvements of above after further ambulation.   ____________________________ Lamar Blinks, MD bjk:TT D: 01/06/2014 19:24:14 ET T: 01/06/2014 19:38:50 ET JOB#: [id number]  cc: Lamar Blinks, MD, <Dictator> Lamar Blinks MD ELECTRONICALLY SIGNED 01/08/2014 7:55

## 2014-05-22 NOTE — Discharge Summary (Signed)
PATIENT NAME:  [name], [name] MR#:  [id number] DATE OF BIRTH:  October 13, 1926  DATE OF ADMISSION:  01/06/2014 DATE OF DISCHARGE:  01/08/2014  DISCHARGE DIAGNOSES:  1.  Acute on chronic systolic congestive heart failure with ejection fraction of 30%.  2.  Acute bronchitis.  3.  Hypertension.  4.  Paroxysmal atrial fibrillation.  5.  Hyperthyroidism.    6.  Hyperlipidemia.   DISCHARGE MEDICATIONS:  1. Pravastatin 20 mg daily.  2. Ocuvite 1 tablet daily.  3. Senokot 8.6 mg daily.  4. Digoxin 125 mcg daily.  5. Lisinopril 2.5 mg daily.  6. Methimazole 5 mg 2 tablets daily.  7. Metoprolol 100 mg 2 times a day.  8. Centrum Silver 1 tablet daily.  9. Potassium chloride 20 mEq 2 times a day.  10. Lasix 40 mg 2 times a day.  11. Aspirin 325 mg daily.  12. Levofloxacin 250 mg oral once a day for 5 days.  13. Prednisone 60 mg tapered over 6 days.  14. Ventolin HFA 2 puffs inhaled every 4 hours as needed for shortness of breath.  15. Guaifenesin 10 mL 3 times a day as needed for cough.   DISCHARGE INSTRUCTIONS: Low-sodium, regular consistency diet. Activity as tolerated. Follow up with primary care physician in 1-2 weeks.   IMAGING STUDIES:  1.  Include a chest x-ray which showed pulmonary edema.  2.  Echocardiogram showed aortic bioprosthetic valve in place, EF of 30%, nothing acute.  CONSULTATIONS:  Dr. Gwen PoundsKowalski with cardiology.   ADMITTING HISTORY AND PHYSICAL: Please see detailed H and P dictated previously. In brief an 81 year old Caucasian female patient with history of chronic systolic CHF, hypertension, paroxysmal atrial fibrillation, aortic valve replacement, presented to the hospital complaining of shortness of breath. The patient was found to have acute bronchitis, CHF, and admitted to hospitalist service.  The patient improved well, diuresed well on IV Lasix. She was continued on IV steroids, nebulizers, antibiotics, oxygen support as needed. The patient improved well, by day of  discharge has mild wheezing, has requested to be discharged home. I think she has improved well and can be discharged home in a stable condition today.    PHYSICAL EXAMINATION:   HEART:  S1, S2 heard. Systolic murmur.  LUNGS: Have mild expiratory wheezes but good air entry. The patient has ambulated well without any trouble breathing.  EXTREMITIES:  No edema.   The patient had some mild elevation in troponin of 0.25 which has been stable on repeat check.    TIME SPENT TODAY ON THIS DISCHARGE:  40 minutes.     ____________________________ Molinda BailiffSrikar R. Edris Schneck, MD srs:bu D: 01/08/2014 12:32:41 ET T: 01/08/2014 19:15:40 ET JOB#: [id number]  cc: Wardell HeathSrikar R. Ladona Rosten, MD, <Dictator> Orie FishermanSRIKAR R Mako Pelfrey MD ELECTRONICALLY SIGNED 01/14/2014 13:09

## 2014-05-23 NOTE — Consult Note (Signed)
PATIENT NAME:  [name], [name] MR#:  [id number] DATE OF BIRTH:  1926-09-18  DATE OF CONSULTATION:  03/10/2011  CONSULTING PHYSICIAN:  Marcina MillardAlexander Osiris Charles, MD  PRIMARY CARE PHYSICIAN: Alonna BucklerAndrew Lamb, MD   CHIEF COMPLAINT: Shortness of breath.   HISTORY OF PRESENT ILLNESS: The patient is an 81 year old female referred for evaluation of congestive heart failure. The patient has known insignificant coronary artery disease, severe aortic stenosis, pulmonary fibrosis and paroxysmal atrial fibrillation. The patient was in her usual state of health until she developed increasing shortness of breath and [facility] on 03/10/2011. The patient was admitted to telemetry where she has shown clinical improvement. Admission labs were notable for borderline elevated troponin of 0.12 in the absence of chest pain. The patient is now referred for further evaluation.   PAST MEDICAL HISTORY:  1. Severe aortic stenosis.  2. Insignificant coronary artery disease by cardiac catheterization.  3. Pulmonary fibrosis.  4. Hypertension.  5. Paroxysmal atrial fibrillation.  6. Hyperlipidemia.   MEDICATIONS:  1. Xarelto 20 mg daily.  2. Metoprolol succinate 50 mg daily.  3. Losartan 50 mg daily.  4. Simvastatin 10 mg daily.  5. Cardizem CD 240 mg daily.  6. Ocuvite 1 tab daily.  7. Simvastatin 10 mg daily.  8. Caltrate Plus tablets 600/400 mg.  9. Calcium carbonate 1 daily.  10. Tramadol 1 tab every 6 hours p.r.n.   SOCIAL HISTORY: The patient has a remote tobacco abuse history.  FAMILY HISTORY: Father died status post myocardial infarction at age 79.    REVIEW OF SYSTEMS: CONSTITUTIONAL: No fever or chills. EYES: No blurry vision. EARS: No hearing loss. RESPIRATORY: Shortness of breath and nonproductive cough as described above. CARDIOVASCULAR: The patient denies chest pain. GI: The patient denies nausea, vomiting, diarrhea, or constipation. GU: The patient denies dysuria, hematuria.  MUSCULOSKELETAL: The patient denies arthralgias or myalgias. NEUROLOGICAL: The patient denies focal muscle weakness or numbness. PSYCHIATRIC: The patient denies anxiety or depression.   PHYSICAL EXAMINATION:  VITAL SIGNS: Blood pressure 177/88, pulse 76, respirations 18, temperature 97.6, pulse oximetry 94%.   HEENT: Pupils are equal and reactive to light and accommodation.   NECK: Supple without thyromegaly.   LUNGS: Decreased breath sounds in both lung fields.   CARDIOVASCULAR: Normal jugular venous pressure. Normal point of maximal impulse. Regular rate and rhythm. Normal S1, S2. Grade 2/6 systolic murmur.   ABDOMEN: Soft, nontender without hepatosplenomegaly.   EXTREMITIES: There is no cyanosis, clubbing, or edema. Pulses were intact bilaterally.   MUSCULOSKELETAL: Normal muscle tone.   NEUROLOGICAL: The patient is alert and oriented x3. Motor and sensory are both grossly intact.   IMPRESSION: An 81 year old female with insignificant coronary artery disease, severe aortic stenosis and pulmonary fibrosis who presents with respiratory failure which is likely multifactorial secondary to underlying pulmonary fibrosis as well as an element of congestive heart failure with known severe aortic stenosis. Blood pressure is elevated.     RECOMMENDATIONS:  1. I agree with overall current therapy.  2. Continue diuresis.  3. Follow BUN and creatinine closely.  4. I would defer full-dose anticoagulation.  5. Consider repeat 2-D echocardiogram.  6. I would defer invasive cardiac evaluation since the patient is felt not to be a candidate for aortic valve replacement.   ____________________________ Marcina MillardAlexander Lake Breeding, MD ap:cbb D: 03/10/2011 10:53:24 ET T: 03/10/2011 12:10:47 ET JOB#: [id number]  cc: Marcina MillardAlexander Coby Antrobus, MD, <Dictator> Marcina MillardALEXANDER Rasheen Bells MD ELECTRONICALLY SIGNED 03/16/2011 10:57

## 2014-05-23 NOTE — Discharge Summary (Signed)
PATIENT NAME:  [name], ALUMBAUGH MR#:  [id number] DATE OF BIRTH:  15-May-1926  DATE OF ADMISSION:  03/10/2011 DATE OF DISCHARGE:  03/10/2011  DISCHARGE DIAGNOSES:  1. Shortness of breath possibly due to mild mixed congestive heart failure exacerbation in conjunction with anxiety and severe aortic stenosis.  2. Elevated troponin due to demand ischemia.   3. Severe aortic stenosis. The patient is not a surgical candidate.  4. Paroxysmal atrial fibrillation, currently in normal sinus rhythm.  5. History of coronary artery disease. 6. Hypertension. 7. History of recent gastrointestinal bleed with oral anticoagulation. The patient is only on aspirin. She has been taken off Xarelto.  8. Hyperglycemia.  9. Anemia of chronic disease.  10. Hypokalemia, replaced.   DISPOSITION: The patient is being discharged home.   FOLLOWUP:  1. Follow up with primary care physician, Dr. Alonna Buckler, in 1 to 2 weeks after discharge.  2. Follow up with cardiologist, Dr. Gwen Pounds, in 1 to 2 weeks after discharge.   DIET: Low sodium.   ACTIVITY: As tolerated.   DISCHARGE MEDICATIONS:  1. Lasix 40 mg b.i.d.  2. KCl 40 mEq daily.  3. Xanax 0.25 mg every 8 hours p.r.n.  4. Losartan 50 mg daily.  5. Toprol-XL 50 mg daily.  6. Cardizem 240 mg daily.  7. Aspirin 81 mg daily.  8. Tylenol/tramadol 25/27.5, 1 tablet b.i.d.   CONSULTATIONS: Cardiology consultation with Dr. Darrold Junker.   LABORATORY, DIAGNOSTIC AND RADIOLOGICAL DATA:  Chest x-ray showed mild congestive heart failure.  ABG showed no abnormality.  CBC was essentially normal other than hemoglobin of 11.9.  Troponins enzymes ranging from 0.12 to 0.13. Normal CK.  BUN 16, creatinine 0.59, potassium 3.4, chloride 141.  Comprehensive metabolic panel normal.  BNP 2492.   HOSPITAL COURSE: The patient is an 81 year old female with past medical history of paroxysmal atrial fibrillation, aortic stenosis, a nonsurgical candidate, coronary artery disease,  status post myocardial infarction x2 with stent placement, who presented with shortness of breath.   1. Shortness of breath: This was possibly felt to be secondary to mild mixed congestive heart failure exacerbation in conjunction with severe aortic stenosis. The patient's chest x-ray showed minimal pulmonary vascular congestion and BNP of 2942. As per the patient and the son, anxiety may also be playing a role in the patient's presenting symptoms. The patient was evaluated by Dr. Darrold Junker during the hospitalization. He recommended conservative management and no additional intervention. The patient symptomatically felt better.  2. Elevated troponin: This was possibly felt to be due to demand ischemia. The patient had no chest pain and no evidence of acute sinus syndrome.  3. Severe aortic stenosis: The patient is not a surgical candidate.  4. Paroxysmal atrial fibrillation: Currently the patient is in normal sinus rhythm.  5. History of coronary artery disease: She is being treated with aspirin, beta blocker, and ARB.  6. Hypertension: The patient's blood pressure was a little bit elevated; therefore, Lasix has been added to her treatment regimen. It will help with her congestive heart failure as well as blood pressure control.  7. Recent gastrointestinal bleed after oral anticoagulation: The patient has been taken off Xarelto and is currently only on aspirin.  8. Hypokalemia: Has been replaced.  9. Anemia: Likely of chronic disease, stable.  10. Hyperglycemia: Likely reactive.   DISPOSITION: The patient is being discharged home in stable condition.  TIME SPENT: 45 minutes.   ____________________________ Darrick Meigs, MD sp:cbb D: 03/10/2011 15:33:08 ET T: 03/10/2011 15:57:55 ET JOB#: [id number]  cc: Shelbey Spindler, MD, <Dictator> ReoDarrick Meigsla MosherAndrew S. Randa LynnLamb, MD Darrick MeigsSANGEETA Jordan Pardini MD ELECTRONICALLY SIGNED 03/11/2011 15:23

## 2014-05-23 NOTE — Consult Note (Signed)
PATIENT NAME:  [name], [name] MR#:  [id number] DATE OF BIRTH:  [birth year]  DATE OF CONSULTATION:  01/09/2011  REFERRING PHYSICIAN:   CONSULTING PHYSICIAN:  Lamar BlinksBruce West. Albertine Lafoy, MD  REASON FOR CONSULTATION: Shortness of breath with known aortic stenosis.   HISTORY OF PRESENT ILLNESS: This is an 81 year old female with known history of coronary artery disease and aortic valve stenosis admitted recently for shortness of breath and recent upper respiratory tract infection with a nonproductive cough. The patient has had appropriate medication management and was discharged to home. The patient did have continued shortness of breath and weakness and was seen in the Emergency Room with some mild tachycardia. That tachycardia has resolved at this time. She is feeling much better at this time after inhalers. Her aortic valve stenosis has been stable and does not require further intervention at this time. There has been no evidence of acute myocardial infarction or congestive heart failure today.   Remainder of review of systems negative for vision change, ringing in the ears, hearing loss, cough, congestion, heartburn, nausea, vomiting, diarrhea, bloody stools, stomach pain, extremity pain, leg weakness, cramping of the buttocks, known blood clots, headaches, blackouts, dizzy spells, nosebleeds, congestion, trouble swallowing, frequent urination, urination at night, muscle weakness, numbness, anxiety, depression, skin lesions, skin rashes.   PAST MEDICAL HISTORY:  1. Known myocardial infarction status post previous PCI and stent placement.  2. Aortic stenosis.  3. Hypertension.  4. Paroxysmal atrial fibrillation.   FAMILY HISTORY: Father died of myocardial infarction.   SOCIAL HISTORY: The patient has no tobacco use at this time or alcohol use.   ALLERGIES: No known drug allergies.   CURRENT MEDICATIONS: As listed.   PHYSICAL EXAMINATION:   VITAL SIGNS: Blood pressure 126/68 bilaterally, heart rate  72 upright, reclining, and regular.   GENERAL: She is a well appearing female in no acute distress.   HEENT: No icterus, thyromegaly, ulcers, hemorrhage, or xanthelasma.   CARDIOVASCULAR: Regular rate and rhythm. Normal S1. Soft S2 with a 3 out of 6 right upper sternal border murmur radiating throughout and into the carotids. PMI is diffuse. Carotid upstroke normal with murmur radiation.    LUNGS: Lots of wheezing and expiratory wheezing with decreased breath sounds.   ABDOMEN: Soft, nontender without hepatosplenomegaly or masses. Abdominal aorta is normal size without bruit.   EXTREMITIES: 2+ bilateral pulses in dorsal, pedal, radial, and femoral arteries without lower extremity edema, cyanosis, clubbing, or ulcers.   NEUROLOGIC: She is oriented to time, place, and person with normal mood and affect.   ASSESSMENT: This is a an 81 year old female with mild congestive heart failure due to hypoxia from recent upper respiratory tract infection, severe aortic stenosis, hypertension relatively stable and okay for discharge to home.   RECOMMENDATIONS:  1. Continue current medical regimen for heart rate, blood pressure control including Lasix for lower extremity edema and pulmonary edema.  2. Continue with antibiotics.  3. Inhalers.  4. No further cardiac intervention.  5. Follow-up in two days.   ____________________________ Lamar BlinksBruce West. Jesten Cappuccio, MD bjk:drc D: 01/10/2011 18:00:28 ET T: 01/11/2011 09:31:03 ET JOB#: [id number] cc: Lamar BlinksBruce West. Mica Releford, MD, <Dictator> Lamar BlinksBRUCE West Britaney Espaillat MD ELECTRONICALLY SIGNED 02/05/2011 9:46

## 2014-05-23 NOTE — Consult Note (Signed)
Brief Consult Note: Diagnosis: Resp failure, multifactorial, secondary to pulmonary fibrosis and CHF. Known severe AS, felt not to be a candidate for AVR. h/o PAF, currently in sinus.   Patient was seen by consultant.   Consult note dictated.   Comments: REC  Agree with current therapy, cont diuresis, defer full dose anticoagulation, review echo, defer invasive cardiac eval since patient not felt to be AVR surgical candidate.  Electronic Signatures: Marcina MillardParaschos, Kyshaun Barnette (MD)  (Signed 09-Feb-13 10:56)  Authored: Brief Consult Note   Last Updated: 09-Feb-13 10:56 by Marcina MillardParaschos, Nori Poland (MD)

## 2014-05-23 NOTE — H&P (Signed)
PATIENT NAME:  [name], [name] MR#:  [id number] DATE OF BIRTH:  26-Mar-1926  DATE OF ADMISSION:  03/10/2011  PRIMARY CARE PHYSICIAN: Alonna Buckler, MD     CHIEF COMPLAINT: Increased shortness of breath since yesterday.   HISTORY OF PRESENT ILLNESS: [name] is an 81 year old pleasant Caucasian female with past medical history of severe aortic stenosis, history of coronary artery disease, systemic hypertension, and paroxysmal atrial fibrillation. The patient indicated that since yesterday she developed gradual increase in shortness of breath since the morning. She treated herself with bronchodilator therapy with inhalers without improvement. Finally she decided to come to the Emergency Department for further evaluation and treatment. She denies having any chest pain. No fever. No cough. No sputum production. The patient reports that two weeks ago she had an episode of atrial fibrillation during which she was evaluated by her cardiologist, Dr. Gwen Pounds. At that time she was also placed on oral anticoagulant. She does not recall it but she recognized Xarelto, however, this was soon discontinued after having black tarry stool. She is maintained on aspirin.   REVIEW OF SYSTEMS: CONSTITUTIONAL: Denies any fever. No chills. No night sweats. EYES: Denies any blurring of vision. No double vision. ENT: No hearing impairment. No sore throat or dysphagia. CARDIOVASCULAR: No chest pain. Admits having the shortness of breath and there is some edema in her legs but not very impressive. No syncope. RESPIRATORY: Admits having shortness of breath but no cough. No sputum production. No chest pain. GASTROINTESTINAL: No abdominal pain, no nausea, no vomiting, no diarrhea. GENITOURINARY: No dysuria or frequency of urination. MUSCULOSKELETAL: No joint swelling or pain. No muscular pain or swelling. INTEGUMENTARY: No skin rash. No ulcers. NEUROLOGIC: No focal weakness. No seizure activity. No headache. PSYCHIATRY: No anxiety or  depression. ENDOCRINE: No night sweats, heat or cold intolerance.   PAST MEDICAL HISTORY:  1. History of severe aortic stenosis. The patient indicates that she is not a surgical candidate.  2. History of coronary artery disease, status post myocardial infarction x2 and history of stent implant.  3. The patient was told that she has pulmonary fibrosis.  4. History of systemic hypertension.  5. History of paroxysmal atrial fibrillation. 6. History of osteoarthritis and osteoporosis.  7. History of spinal scoliosis and spinal stenosis.  8. History of chronic recurrent urinary tract infections.  9. History of hyperlipidemia.   PAST SURGICAL HISTORY:  1. History of appendectomy. 2. Hysterectomy.  3. History of varicose vein surgeries.  4. History of hemorrhoidectomy. 5. Fibroid removal. 6. History of cardiac stent implant in 2007.   SOCIAL HISTORY: She is widowed. Lives at home alone. She is retired from working in a [employer]. Prior to that she worked for about 20 years in a [employer].   SOCIAL HABITS: Nonsmoker. Remote history of smoking. She quit 30 years ago. Prior to that she used to smoke 1 to 1-1/2 pack a day. No history of alcohol abuse.   FAMILY HISTORY: Her mother suffered from lung cancer and breast cancer. She has three sisters who had coronary artery disease.   ADMISSION MEDICATIONS:  1. Losartan 50 mg once a day. 2. Metoprolol succinate extended-release 50 mg once a day. 3. Diltiazem CD 240 mg once a day.  4. Aspirin once a day.   ALLERGIES: Penicillin causes mouth swelling. She also indicated that she reacted to prednisone causing elevated blood pressure. This is a side effect and not a true allergy.   PHYSICAL EXAMINATION:   VITAL SIGNS: Blood pressure 174/81,  respiratory rate 24, pulse 56, temperature 96.5, oxygen saturation 95%. She was placed on BiPAP.   GENERAL APPEARANCE: Elderly pleasant lady laying in bed in no acute distress at the time of examination.  She is now off the BiPAP after improvement.   HEAD: No pallor. No icterus. No cyanosis.   EARS, NOSE, AND THROAT: Hearing was normal. Nasal mucosa, lips, and tongue were normal.   EYES: Normal iris and conjunctivae. Pupils about 6 to 7 mm, equal and reactive to light.   NECK: Supple. Trachea at midline. No thyromegaly. No cervical lymphadenopathy.   HEART: Normal S1, S2. There is systolic murmur grade 3/6 at the apex and also at aortic area. No carotid bruits.   RESPIRATORY: The patient is slightly tachypneic. There are a few fine crackles at the bases on both sides. There is spinal scoliosis evident.   ABDOMEN: Soft without tenderness. No hepatosplenomegaly. No masses. No hernias.   SKIN: No ulcers. No subcutaneous nodules. There is trace to +1 bipedal edema more on the left than the right.   NEUROLOGIC: Cranial nerves II through XII are intact. No focal motor deficit.   PSYCHIATRIC: The patient is alert and oriented x3. Mood and affect were normal.   MUSCULOSKELETAL: No joint swelling. No clubbing.   LABORATORY, DIAGNOSTIC, AND RADIOLOGICAL DATA: Chest x-ray showed mild cardiomegaly, pulmonary vascular congestion, evidence of congestive heart failure, and probably early pulmonary edema.   EKG showed sinus bradycardia at rate of 55 per minute. Poor progression of R waves in the anterior chest leads. Probable old anterior myocardial infarction. Evidence of left ventricular hypertrophy and nonspecific ST-T wave abnormalities.   Serum glucose 119. B-type Natriuretic Peptide was significantly elevated at 2942. BUN 16, creatinine 0.5, sodium 141, potassium 3.4. Her liver function tests were normal. CPK was normal at 67. Troponin was slightly elevated at 0.12. CBC showed white count 6000, hemoglobin 11.9, hematocrit 36, platelet count 203. Arterial blood gas showed pH of 7.44, pCO2 39, pO2 161. This is on FiO2 of 40% and BiPAP. It is worth to mention that her troponin during her last admission  in December of 2012 was 0.15, 0.17, and 0.10. Her last echocardiogram was done in December 2012 showing ejection fraction of 45 to 50%. Left atrium is moderately dilated. Severe aortic valvular stenosis. Right ventricular systolic pressure is elevated at 40 to 50%.   ASSESSMENT:  1. Shortness of breath likely secondary to volume overload and acute congestive heart failure with early pulmonary edema.  2. Elevated troponin. This needs to be further monitored.  3. Severe aortic stenosis.  4. History of mild congestive heart failure by recent echocardiogram in December 2012 showing ejection fraction of 45 to 50%.  5. Paroxysmal atrial fibrillation.   6. History of coronary artery disease.  7. History of systemic hypertension.  8. Recent GI bleed after oral anticoagulation.  9. History of spinal stenosis. 10. Spinal scoliosis. 11. Osteoarthritis. 12. Osteoporosis.  13. History of hyperlipidemia.   14. History of pulmonary fibrosis.   PLAN:  1. Admit patient to the telemetry unit.  2. Intravenous Lasix to reduce volume overload.  3. Repeat chest x-ray and B-type Natriuretic Peptide.  4. Check troponin q.8 hours.  5. Cardiology consultation with Dr. Gwen Pounds who is familiar with her condition.  6. I will continue her medications losartan, metoprolol, and diltiazem.  7. For peptic ulcer disease prophylaxis, will place the patient on Protonix 40 mg p.o. daily.  8. For deep vein thrombosis prophylaxis, I will avoid oral  anticoagulation due to recent GI bleed in association with oral anticoagulation, however, I will continue aspirin.  9. Will monitor the patient's condition closely.       TIME NEEDED TO EVALUATE THIS PATIENT: More than 50 minutes.   ____________________________ Carney CornersAmir M. Rudene Rearwish, MD amd:drc D: 03/10/2011 04:47:10 ET T: 03/10/2011 08:31:02 ET JOB#: [id number]  cc: Carney CornersAmir M. Rudene Rearwish, MD, <Dictator> Reola MosherAndrew S. Randa LynnLamb, MD Zollie ScaleAMIR M Carlton Buskey MD ELECTRONICALLY SIGNED 03/10/2011 22:43

## 2015-12-14 IMAGING — CR DG CHEST 1V PORT
1 series · 1 of 1 positions shown · non-contrast
Comparison: Portable exam 2624 hr compared to 04/10/2014

CLINICAL DATA: Acute respiratory failure, history atrial
fibrillation

EXAM:
PORTABLE CHEST - 1 VIEW

[AP]
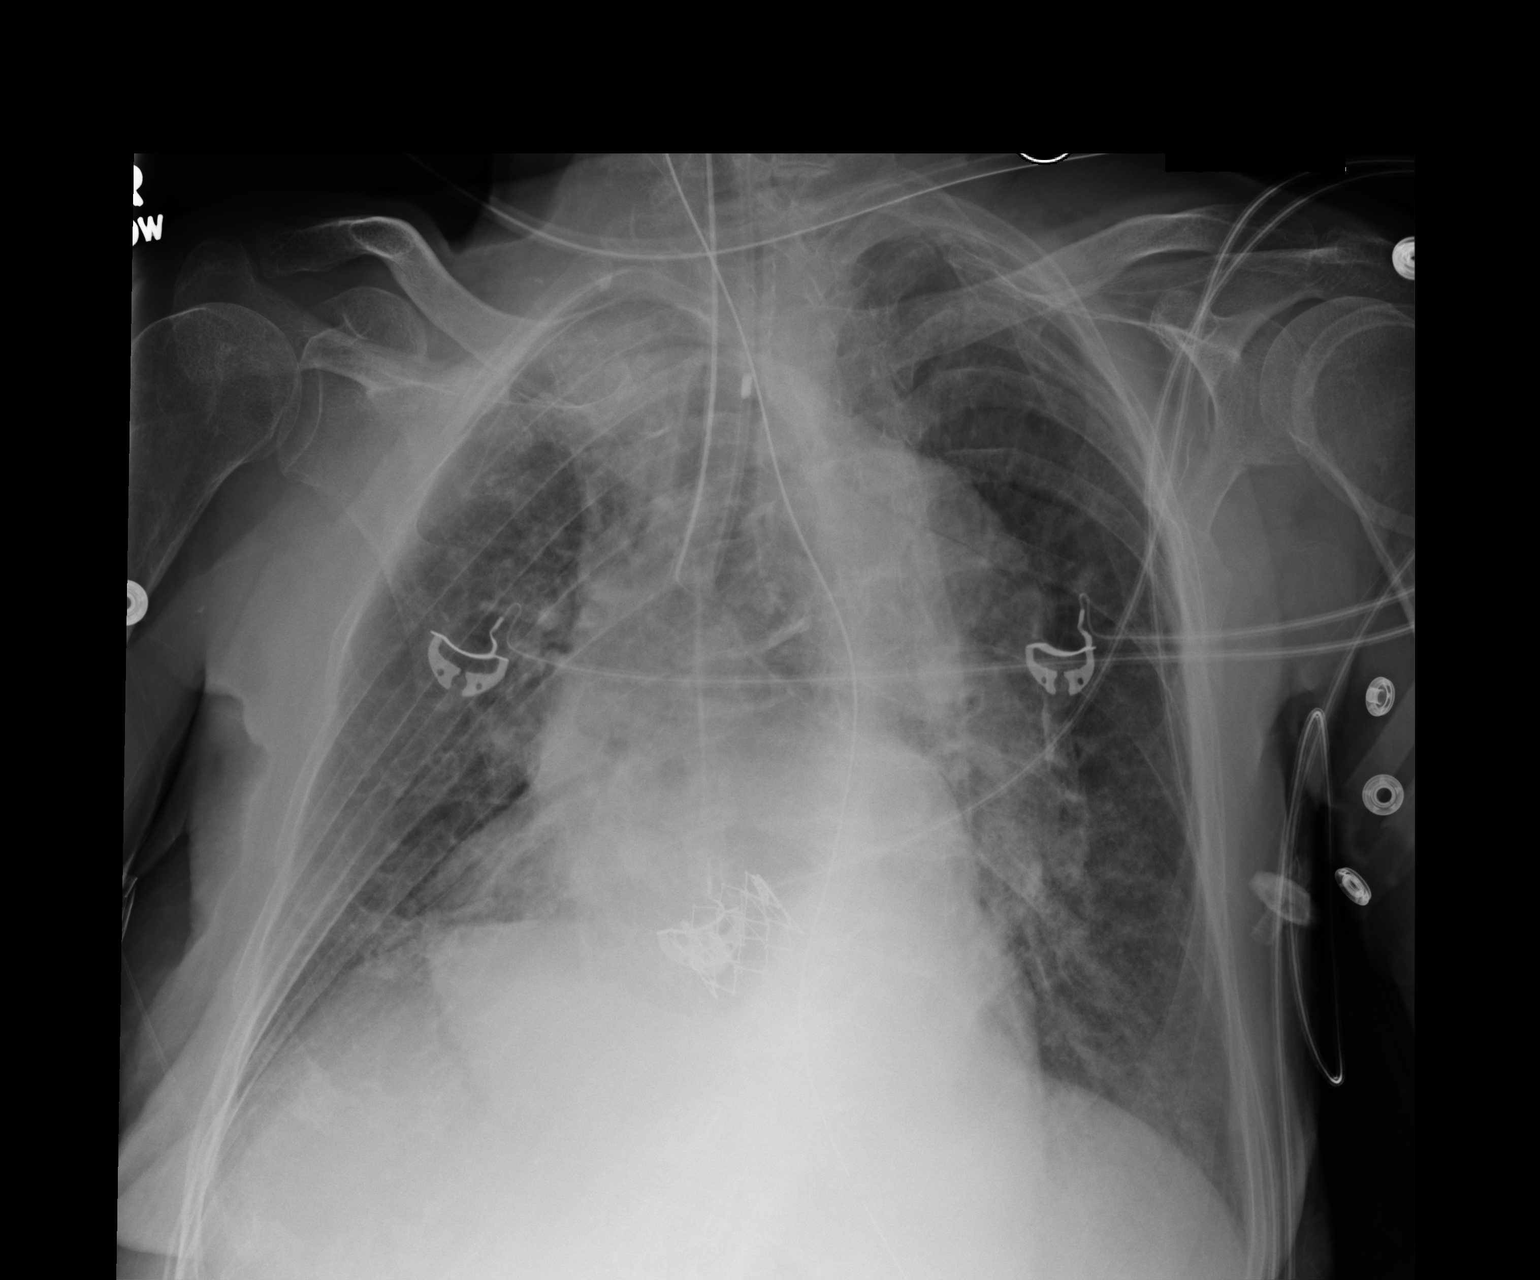

[1 of 1 positions shown; findings below may reference images not displayed]

FINDINGS: Tip of endotracheal tube at carina directed into RIGHT mainstem
bronchus orifice; recommend withdrawal 2-3 cm for placement above
carina.

Nasogastric tube extends into stomach.

Tortuous aorta.

Enlargement of cardiac silhouette post TAVR.

Peribronchial thickening.

Significant rotation to the RIGHT.

No definite infiltrate, pleural effusion, or pneumothorax.
IMPRESSION: Tip of endotracheal tube at carina; recommend withdrawal 2-3 cm.

Critical Value/emergent results were called by telephone at the time
of interpretation on 04/11/2014 at 0953 hr to patient's nurse Gurierrez
[facility], who verbally acknowledged these results.
